# Patient Record
Sex: Male | Born: 1980 | Race: White | Hispanic: No | Marital: Married | Smoking: Never smoker
Health system: Southern US, Community
[De-identification: ages and names within clinical notes are randomized; demographics above are authoritative.]

## PROBLEM LIST (undated history)

## (undated) DIAGNOSIS — R000 Tachycardia, unspecified: Secondary | ICD-10-CM

## (undated) DIAGNOSIS — R7303 Prediabetes: Secondary | ICD-10-CM

## (undated) DIAGNOSIS — R531 Weakness: Secondary | ICD-10-CM

## (undated) DIAGNOSIS — G43009 Migraine without aura, not intractable, without status migrainosus: Secondary | ICD-10-CM

## (undated) DIAGNOSIS — Z8673 Personal history of transient ischemic attack (TIA), and cerebral infarction without residual deficits: Secondary | ICD-10-CM

## (undated) DIAGNOSIS — G4733 Obstructive sleep apnea (adult) (pediatric): Secondary | ICD-10-CM

## (undated) DIAGNOSIS — I10 Essential (primary) hypertension: Secondary | ICD-10-CM

## (undated) DIAGNOSIS — C9241 Acute promyelocytic leukemia, in remission: Secondary | ICD-10-CM

## (undated) DIAGNOSIS — L409 Psoriasis, unspecified: Secondary | ICD-10-CM

## (undated) HISTORY — PX: SHOULDER SURGERY: SHX246

## (undated) HISTORY — DX: Acute promyelocytic leukemia, in remission: C92.41

## (undated) HISTORY — PX: COLONOSCOPY: SHX174

## (undated) HISTORY — DX: Essential (primary) hypertension: I10

## (undated) HISTORY — DX: Weakness: R53.1

## (undated) HISTORY — PX: KIDNEY STONE SURGERY: SHX686

## (undated) HISTORY — DX: Obstructive sleep apnea (adult) (pediatric): G47.33

## (undated) HISTORY — DX: Tachycardia, unspecified: R00.0

## (undated) HISTORY — DX: Personal history of transient ischemic attack (TIA), and cerebral infarction without residual deficits: Z86.73

## (undated) HISTORY — DX: Prediabetes: R73.03

## (undated) HISTORY — PX: FOOT SURGERY: SHX648

## (undated) HISTORY — DX: Psoriasis, unspecified: L40.9

## (undated) HISTORY — DX: Migraine without aura, not intractable, without status migrainosus: G43.009

## (undated) HISTORY — PX: WRIST SURGERY: SHX841

---

## 2004-05-27 ENCOUNTER — Other Ambulatory Visit: Payer: Self-pay

## 2005-01-30 ENCOUNTER — Emergency Department: Payer: Self-pay | Admitting: Emergency Medicine

## 2005-10-26 ENCOUNTER — Emergency Department: Payer: Self-pay | Admitting: Emergency Medicine

## 2006-05-15 ENCOUNTER — Emergency Department: Payer: Self-pay | Admitting: Emergency Medicine

## 2006-11-17 ENCOUNTER — Emergency Department: Payer: Self-pay | Admitting: Emergency Medicine

## 2007-06-05 ENCOUNTER — Emergency Department: Payer: Self-pay

## 2007-06-19 ENCOUNTER — Emergency Department: Payer: Self-pay | Admitting: Emergency Medicine

## 2007-10-10 ENCOUNTER — Emergency Department: Payer: Self-pay | Admitting: Emergency Medicine

## 2008-04-17 ENCOUNTER — Ambulatory Visit: Payer: Self-pay | Admitting: General Practice

## 2008-05-23 ENCOUNTER — Ambulatory Visit: Payer: Self-pay | Admitting: Orthopedic Surgery

## 2008-07-31 ENCOUNTER — Emergency Department: Payer: Self-pay | Admitting: Emergency Medicine

## 2008-08-23 IMAGING — CR DG CHEST 2V
1 series · 2 of 2 positions shown · non-contrast
Comparison: none

REASON FOR EXAM: Pre-employment physical     [employer]           Fax
report to: [id number]
COMMENTS:

PROCEDURE:     DXR - DXR CHEST 2 VIEWS DAESIK VADER  - April 17, 2008 [DATE]
RESULT:     PA and lateral views of the chest were obtained.
The lung fields are clear. No pneumonia, pneumothorax or pleural effusion is
seen. The heart size is normal. No acute bony abnormalities are seen.

[Series 1: view not recorded · 0.17mm/px · 2 of 2 slices shown]
[im 1/2]
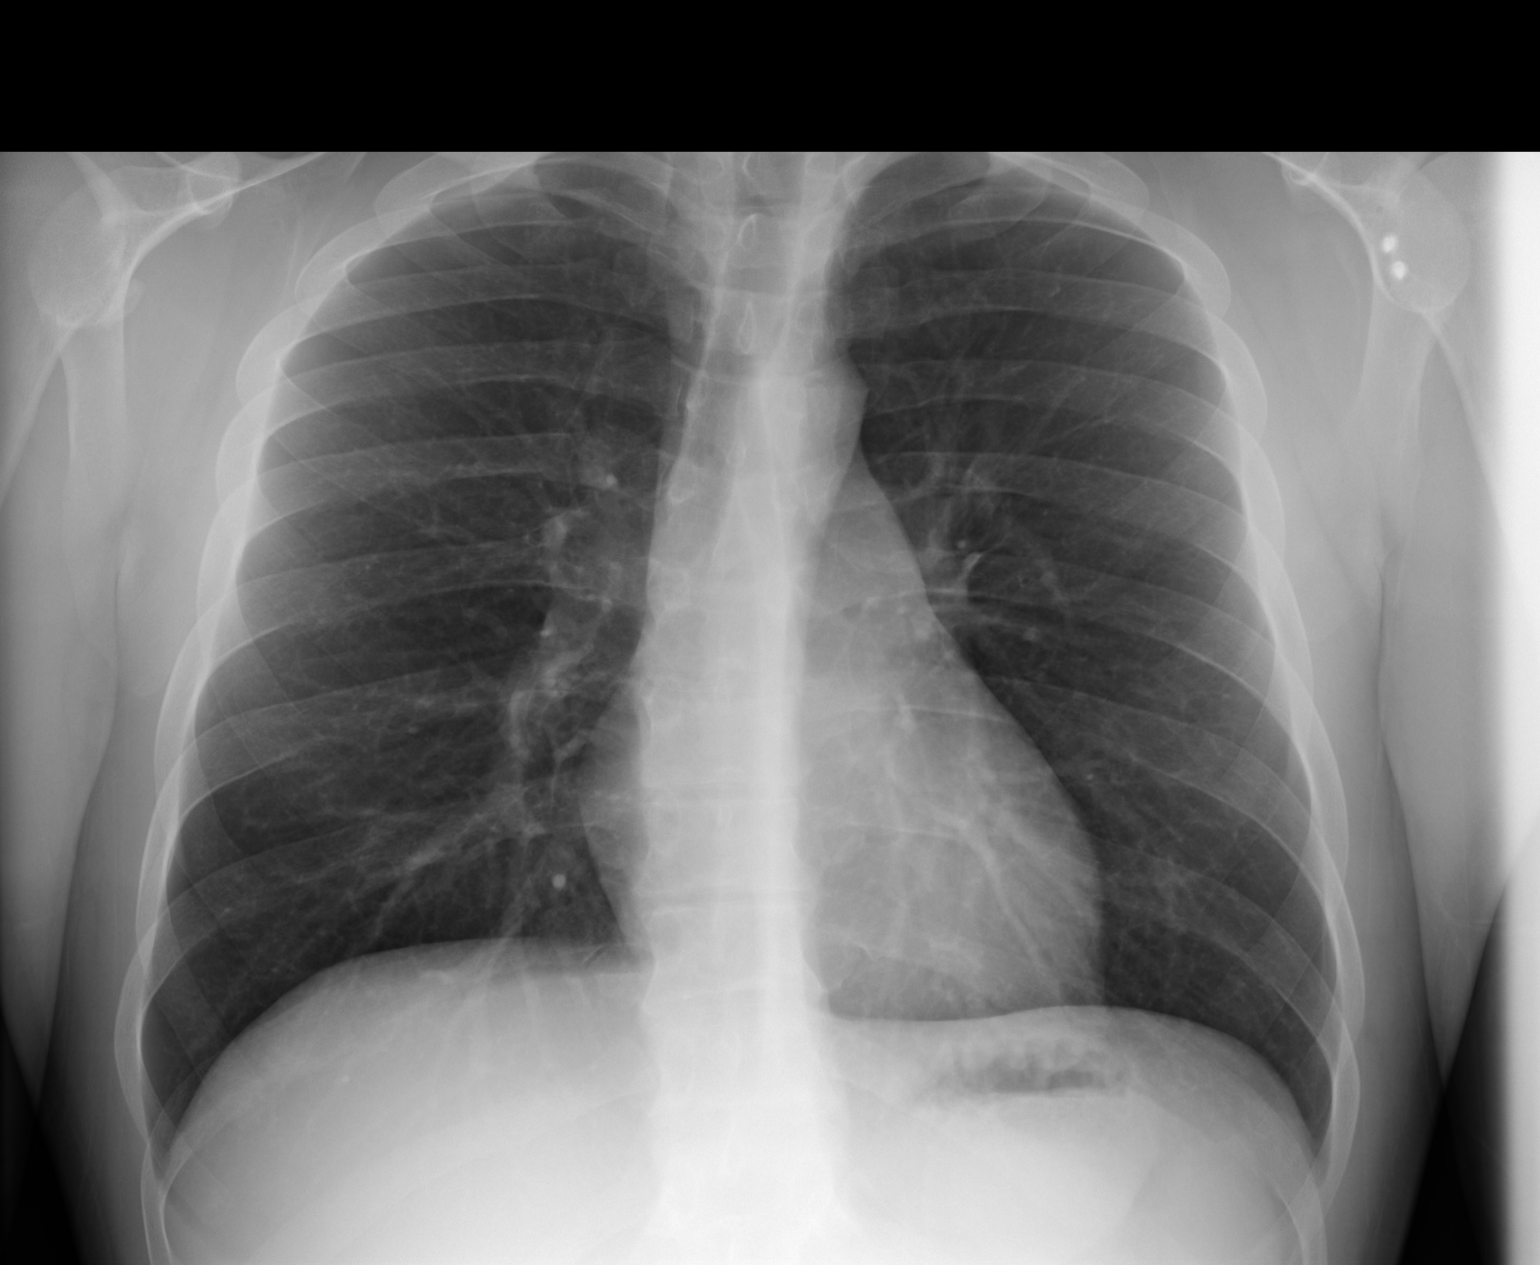
[im 2/2]
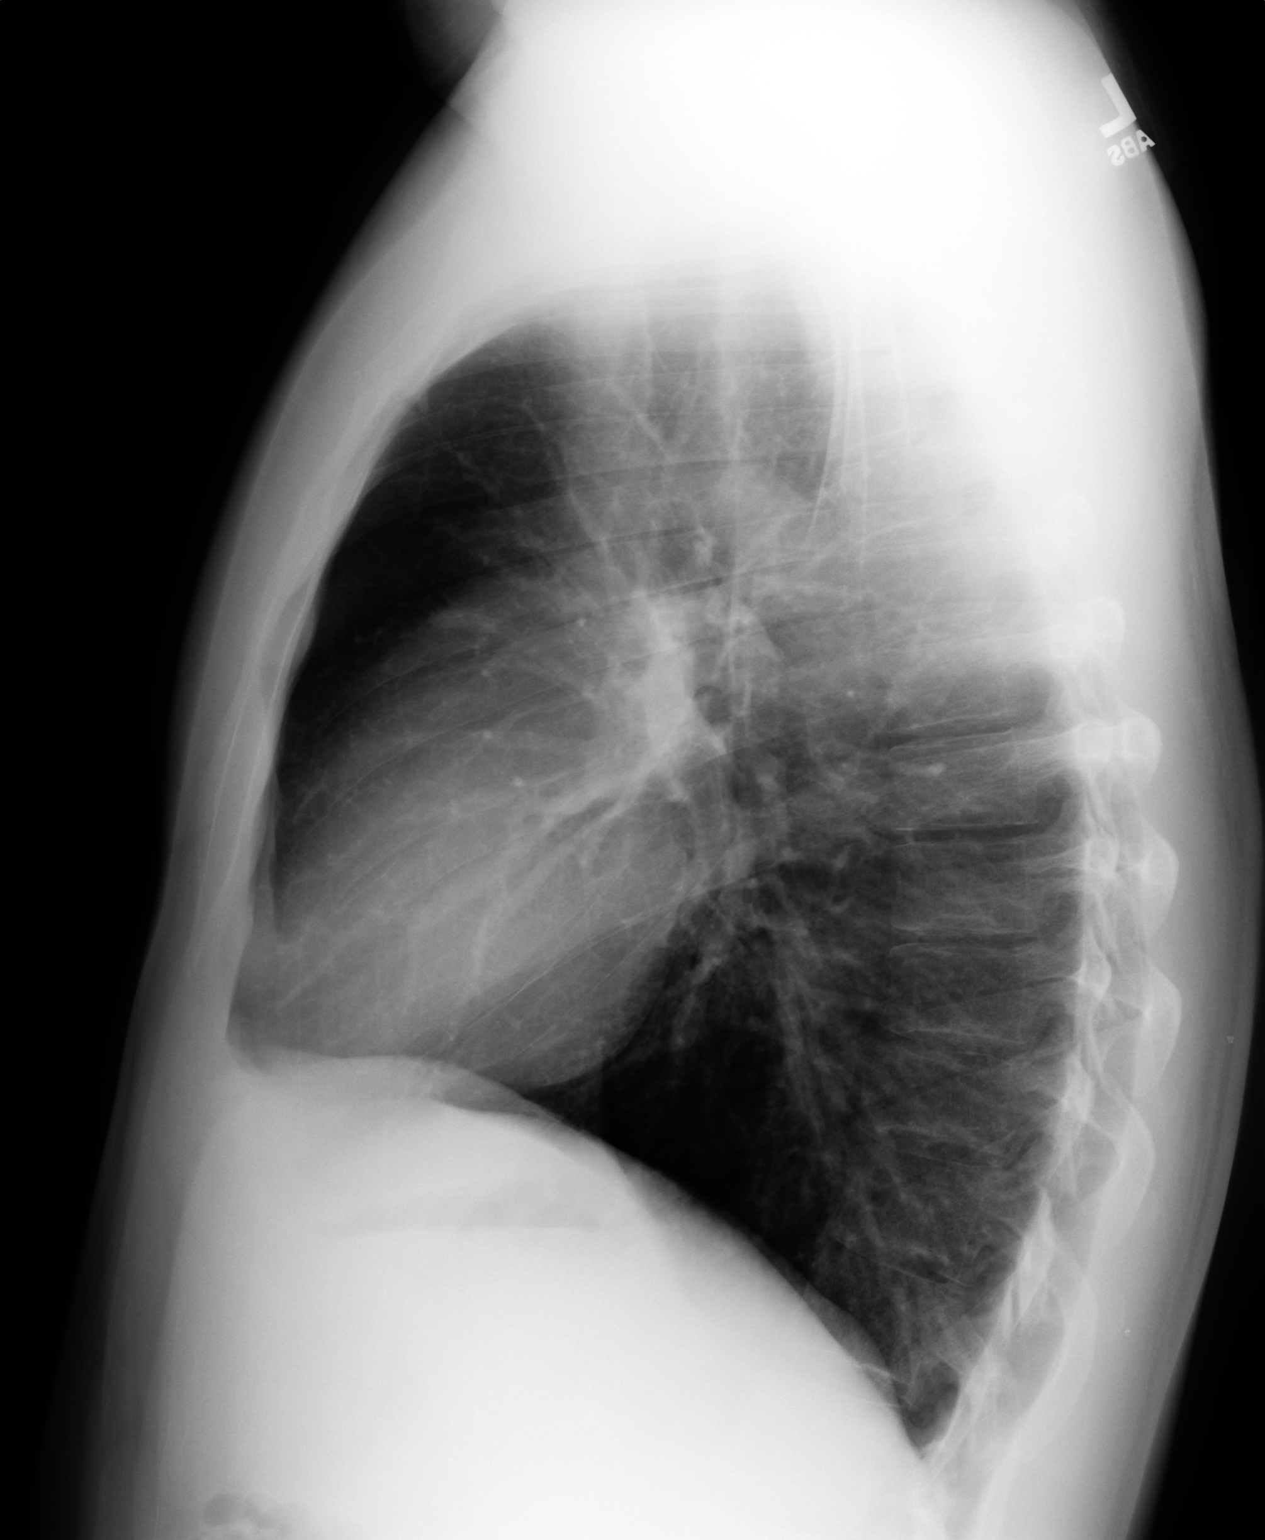

[2 of 2 positions shown; findings below may reference images not displayed]

IMPRESSION: 1.  No acute changes are identified.
2.  The lung fields are clear.
3.  The heart size is normal.

## 2008-10-20 ENCOUNTER — Encounter: Admission: RE | Admit: 2008-10-20 | Discharge: 2008-10-20 | Payer: Self-pay | Admitting: Orthopedic Surgery

## 2008-12-06 IMAGING — CR DG SHOULDER 3+V*L*
1 series · 4 of 4 positions shown · non-contrast
Comparison: none

REASON FOR EXAM: Injury
COMMENTS:

PROCEDURE:     DXR - DXR SHOULDER LEFT COMPLETE  - July 31, 2008  [DATE]
RESULT:     Images of the LEFT shoulder demonstrate the humerus is displaced
consistent with an anterior dislocation. A definite fracture is not
identified in the proximal humerus. The glenoid is poorly seen.

[Series 1: view not recorded · 0.17mm/px · 4 of 4 slices shown]
[im 1/4]
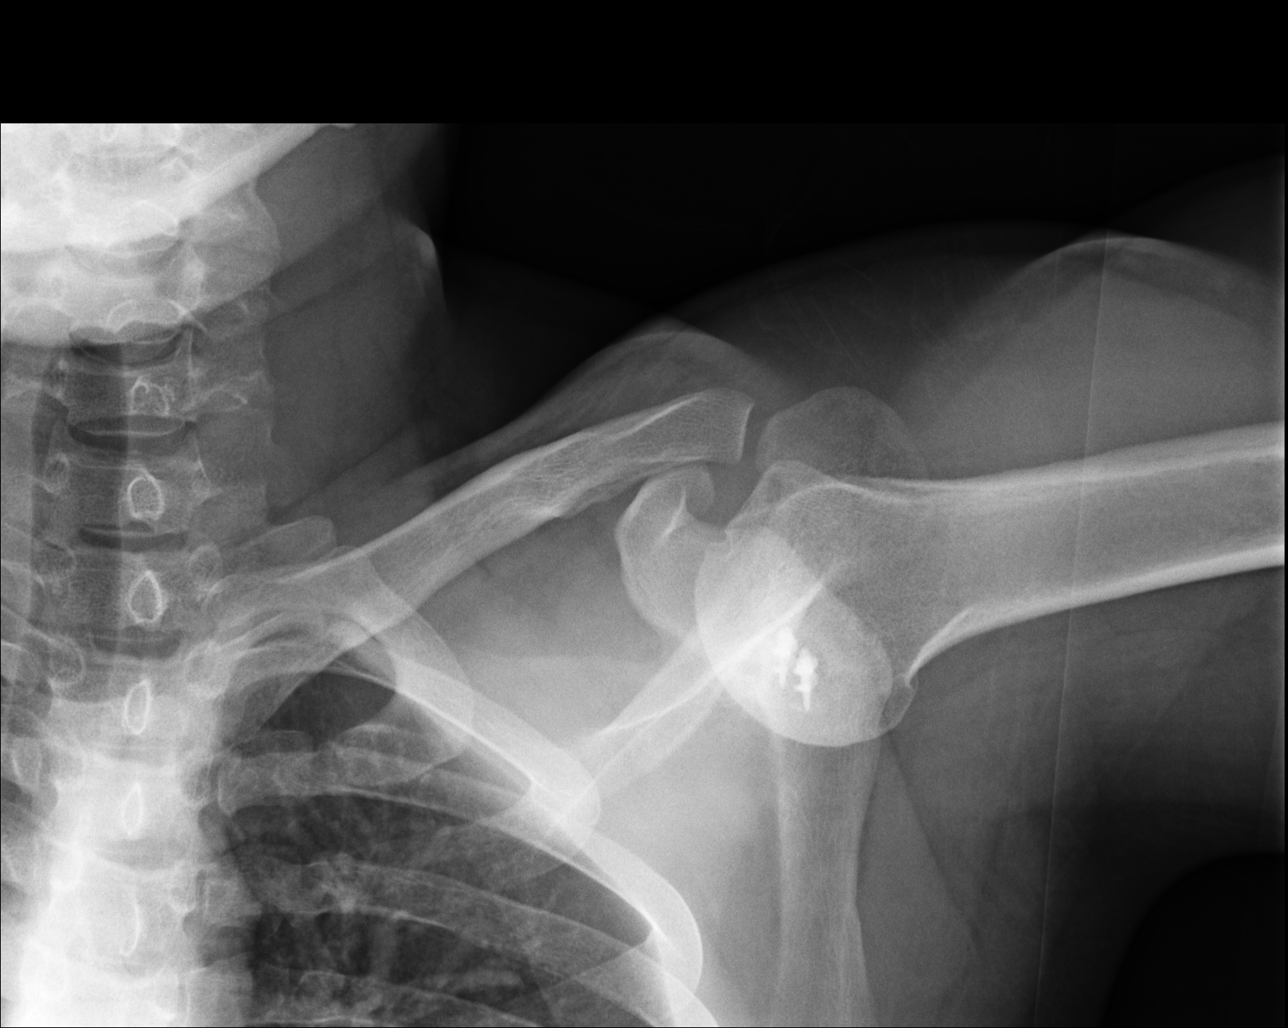
[im 2/4]
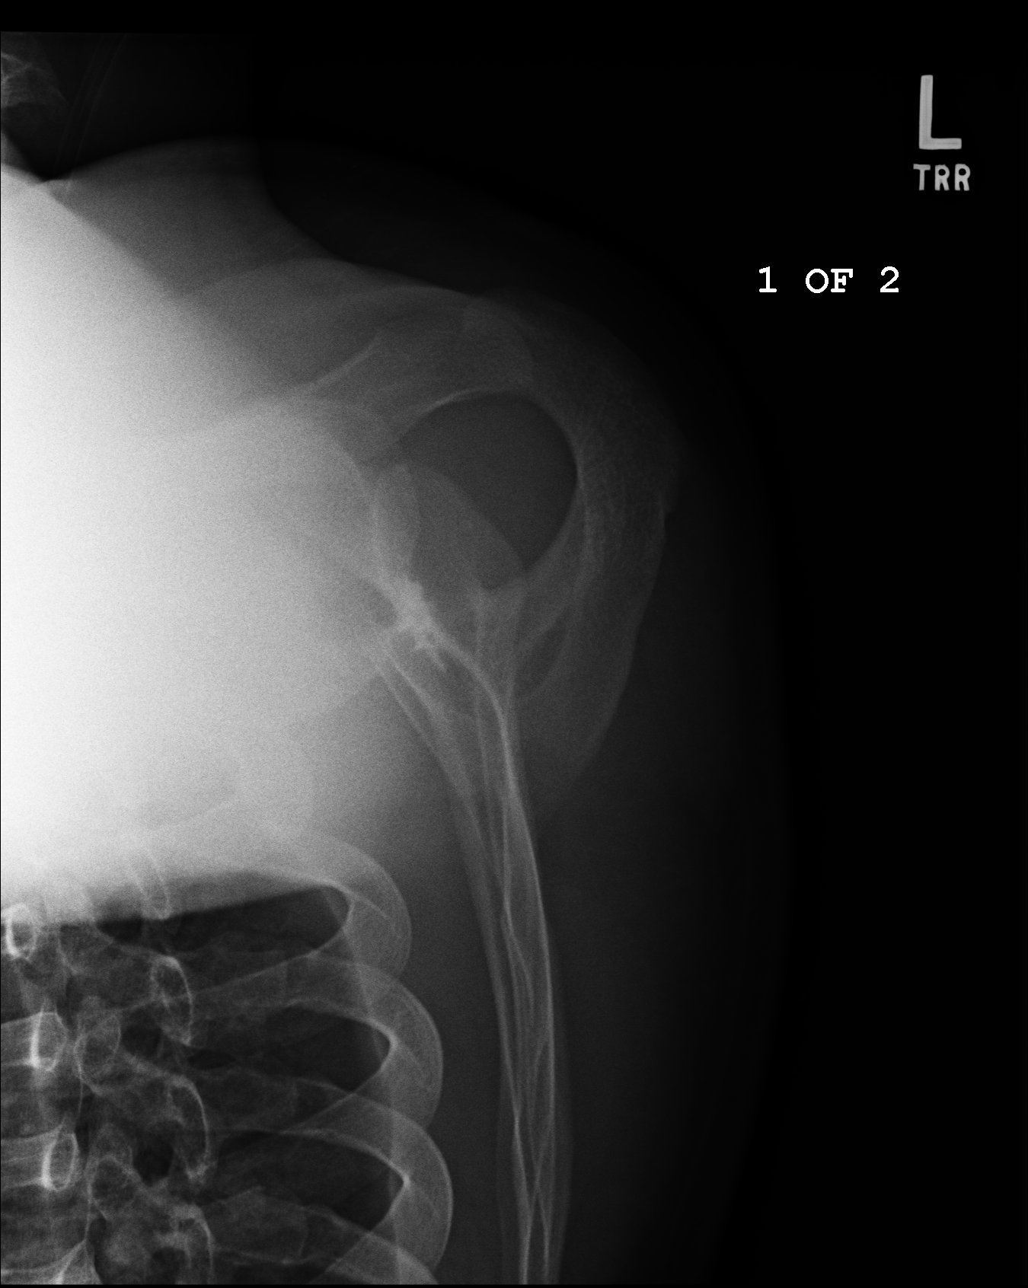
[im 3/4]
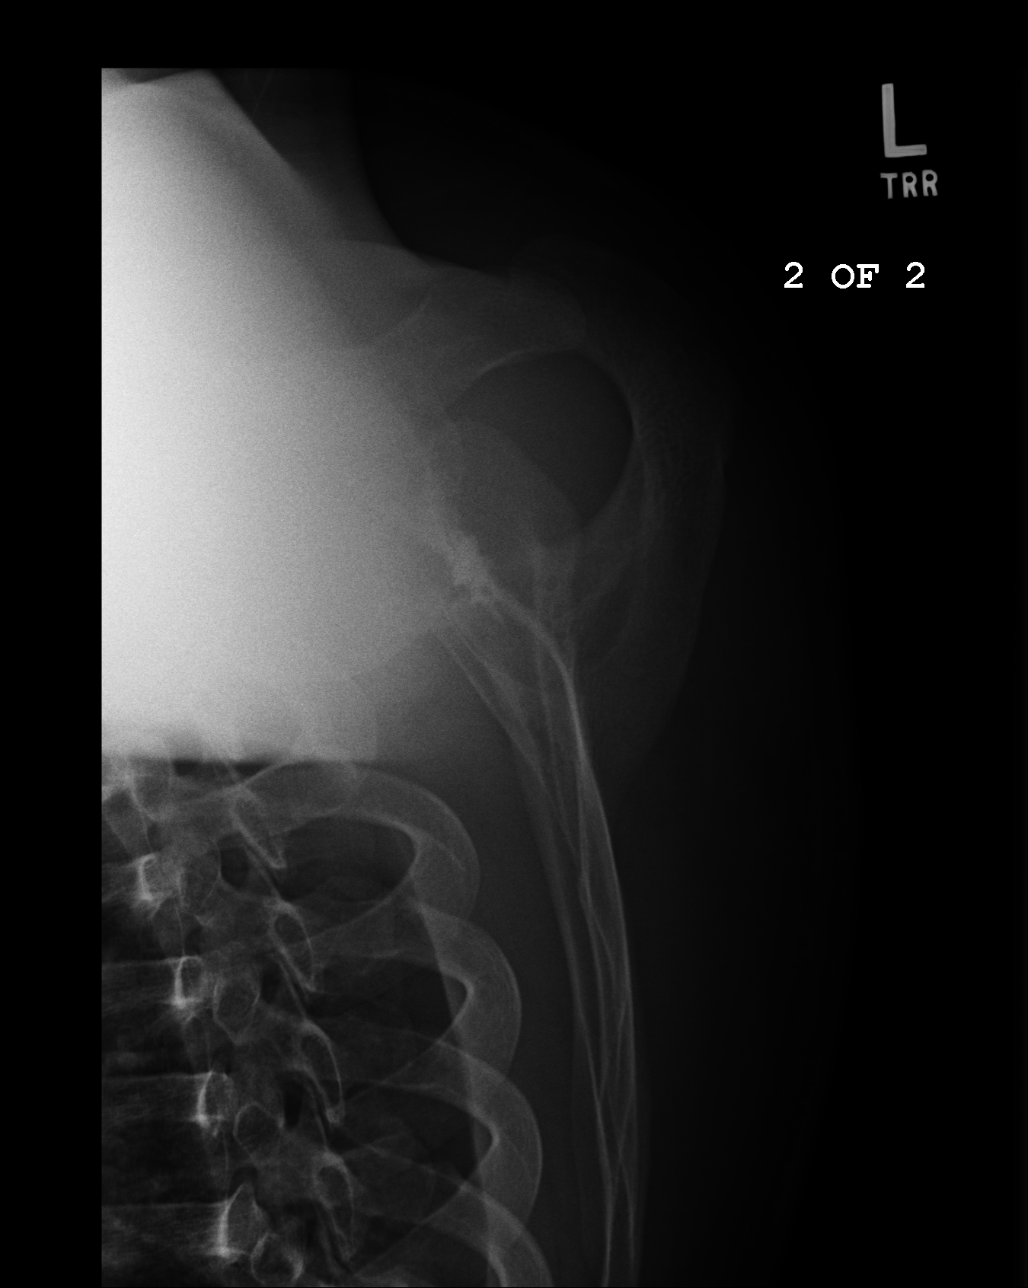
[im 4/4]
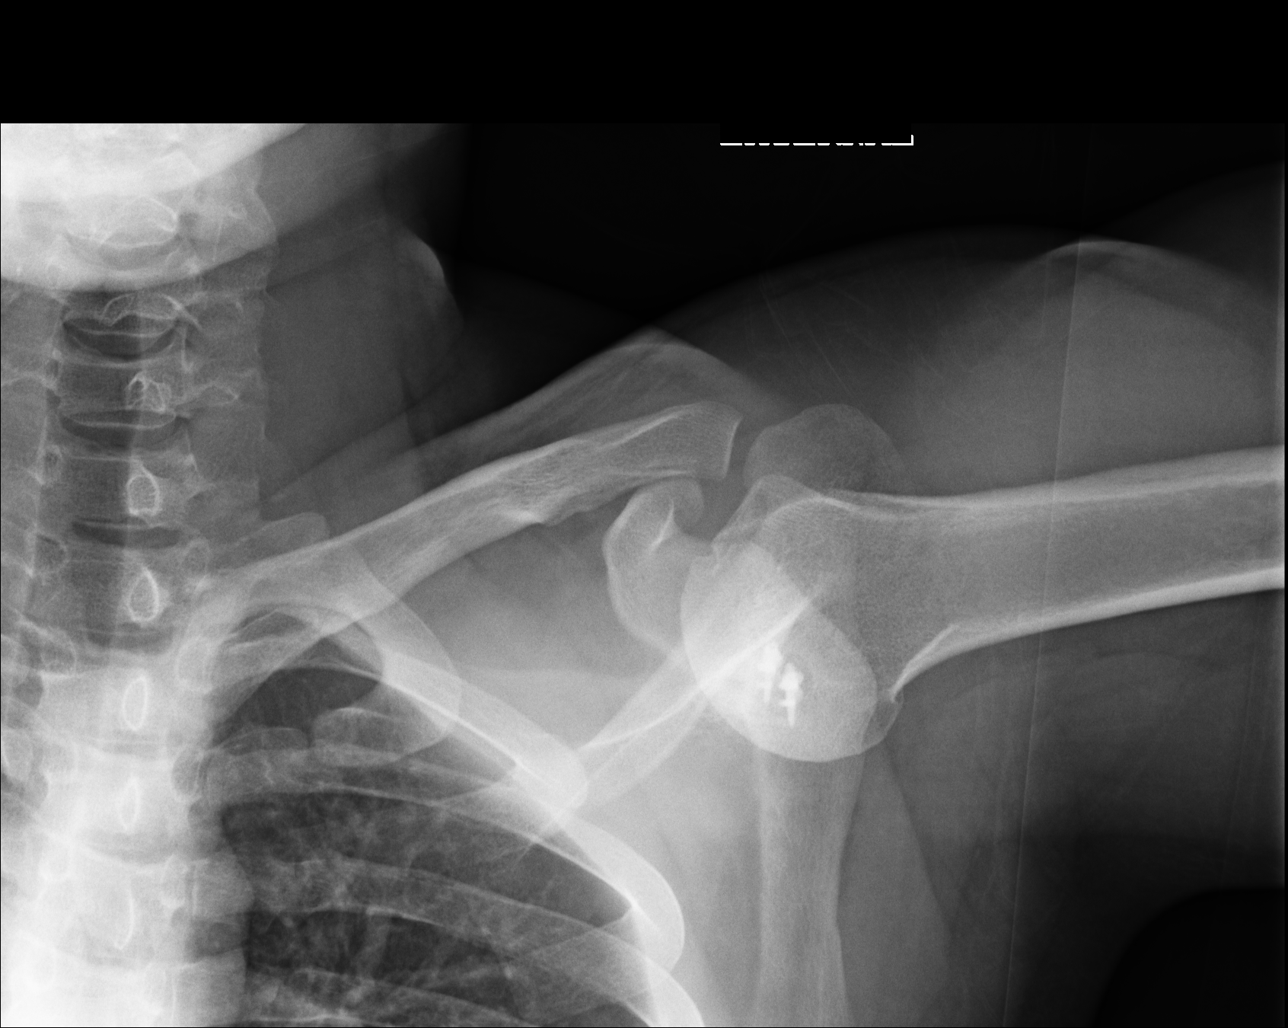

[4 of 4 positions shown; findings below may reference images not displayed]

IMPRESSION: Dislocation of the LEFT humeral head. This appears to be
anterior. Post reduction images are recommended.

## 2009-04-11 ENCOUNTER — Ambulatory Visit (HOSPITAL_COMMUNITY): Admission: RE | Admit: 2009-04-11 | Discharge: 2009-04-11 | Payer: Self-pay | Admitting: Orthopedic Surgery

## 2012-07-26 ENCOUNTER — Encounter (HOSPITAL_COMMUNITY): Payer: Self-pay | Admitting: Emergency Medicine

## 2012-07-26 ENCOUNTER — Observation Stay (HOSPITAL_COMMUNITY)
Admission: EM | Admit: 2012-07-26 | Discharge: 2012-07-27 | Disposition: A | Discharge status: Home / Self Care | Payer: PRIVATE HEALTH INSURANCE | Attending: Emergency Medicine | Admitting: Emergency Medicine

## 2012-07-26 ENCOUNTER — Emergency Department (HOSPITAL_COMMUNITY): Payer: PRIVATE HEALTH INSURANCE

## 2012-07-26 DIAGNOSIS — R079 Chest pain, unspecified: Principal | ICD-10-CM | POA: Insufficient documentation

## 2012-07-26 LAB — CBC WITH DIFFERENTIAL/PLATELET
Basophils Absolute: 0 10*3/uL (ref 0.0–0.1)
Basophils Relative: 0 % (ref 0–1)
Eosinophils Absolute: 0.2 10*3/uL (ref 0.0–0.7)
Eosinophils Relative: 2 % (ref 0–5)
HCT: 41.5 % (ref 39.0–52.0)
Hemoglobin: 15 g/dL (ref 13.0–17.0)
MCH: 31.4 pg (ref 26.0–34.0)
MCHC: 36.1 g/dL — ABNORMAL HIGH (ref 30.0–36.0)
MCV: 86.8 fL (ref 78.0–100.0)
Monocytes Absolute: 0.5 10*3/uL (ref 0.1–1.0)
RBC: 4.78 MIL/uL (ref 4.22–5.81)
RDW: 12.2 % (ref 11.5–15.5)
WBC: 10.7 10*3/uL — ABNORMAL HIGH (ref 4.0–10.5)

## 2012-07-26 LAB — COMPREHENSIVE METABOLIC PANEL
Albumin: 4.2 g/dL (ref 3.5–5.2)
BUN: 13 mg/dL (ref 6–23)
Creatinine, Ser: 1.12 mg/dL (ref 0.50–1.35)
GFR calc Af Amer: >90 mL/min (ref >90)
Glucose, Bld: 93 mg/dL (ref 70–99)
Total Protein: 6.8 g/dL (ref 6.0–8.3)

## 2012-07-26 MED ORDER — KETOROLAC TROMETHAMINE 30 MG/ML IJ SOLN
30.0000 mg | Freq: Once | INTRAMUSCULAR | Status: AC
Start: 2012-07-26 — End: 2012-07-26
  Administered 2012-07-26: 30 mg via INTRAVENOUS
  Filled 2012-07-26: qty 1

## 2012-07-26 NOTE — ED Provider Notes (Signed)
History     CSN: [id number]  Arrival date & time 07/26/12  1250   First MD Initiated Contact with Patient 07/26/12 1300      Chief Complaint  Patient presents with  . Chest Pain    (Consider location/radiation/quality/duration/timing/severity/associated sxs/prior treatment) HPI Comments: Patient with no significant pmh presents after the onset of sharp, stabbing pain to the center of his chest.  It felt as thought "a knife was being pushed into his chest, then pulled back out".  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  He denies any shortness of breath, nausea, but did feel sweaty at one point.  He denies any recent exertional symptoms.  He says the office staff gave him a nitro with minimal relief, and was given another by ems with slight relief.  Feels better now, but not symptom-free.  Patient is a 31 y.o. male presenting with chest pain. The history is provided by the patient.  Chest Pain Episode onset: 11AM. Chest pain occurs constantly. The chest pain is improving. Associated with: nothing. The severity of the pain is moderate. The quality of the pain is described as sharp. The pain does not radiate. Exacerbated by: nothing. Pertinent negatives for primary symptoms include no fever, no cough, no palpitations and no nausea. He tried nitroglycerin for the symptoms. Risk factors include no known risk factors.     History reviewed. No pertinent past medical history.  History reviewed. No pertinent past surgical history.  No family history on file.  History  Substance Use Topics  . Smoking status: Never Smoker   . Smokeless tobacco: Not on file  . Alcohol Use: Yes      Review of Systems  Constitutional: Negative for fever.  Respiratory: Negative for cough.   Cardiovascular: Positive for chest pain. Negative for palpitations.  Gastrointestinal: Negative for nausea.  All other systems reviewed and are negative.    Allergies    Review of patient's allergies indicates no known allergies.  Home Medications   Current Outpatient Rx  Name Route Sig Dispense Refill  . ACETAMINOPHEN 500 MG PO TABS Oral Take 1,000 mg by mouth every 6 (six) hours as needed. For pain      BP 138/92  Pulse 63  Temp 98 F (36.7 C) (Oral)  Resp 12  SpO2 100%  Physical Exam  Nursing note and vitals reviewed. Constitutional: He is oriented to person, place, and time. He appears well-developed and well-nourished. No distress.  HENT:  Head: Normocephalic and atraumatic.  Mouth/Throat: Oropharynx is clear and moist.  Neck: Normal range of motion. Neck supple.  Cardiovascular: Normal rate and regular rhythm.   No murmur heard. Pulmonary/Chest: Effort normal and breath sounds normal. No respiratory distress. He has no wheezes.  Abdominal: Soft. Bowel sounds are normal. He exhibits no distension. There is no tenderness.  Musculoskeletal: Normal range of motion. He exhibits no edema.  Lymphadenopathy:    He has no cervical adenopathy.  Neurological: He is alert and oriented to person, place, and time.  Skin: Skin is warm and dry. He is not diaphoretic.    ED Course  Procedures (including critical care time)  Labs Reviewed - No data to display No results found.   No diagnosis found.   Date: 07/26/2012  Rate: 61  Rhythm: normal sinus rhythm  QRS Axis: normal  Intervals: normal  ST/T Wave abnormalities: normal  Conduction Disutrbances:none  Narrative Interpretation:   Old EKG Reviewed: none available  MDM  The patient presents here with chest discomfort that is atypical for cardiac pain.  His only risk factors are a family history of his father having heart trouble in his 88's.  The patient is 69, healthy, athletic, and has no exertional symptoms.  The workup is unremarkable and he is feeling better with toradol.  I had planned to discharge him to home with follow up as he appears low risk and appropriate for outpatient  workup.  However the wife informed me that "they weren't leaving until something was done".  I had a lengthy conversation with them, and informed them that no cath or invasive procedure would be done until there was some objective evidence of a cardiac etiology.  The most appropriate next test in my opinion is a stress test.  We have decided that we will place him in the CDU for rule out of mi and stress test in the morning.  If this test is normal, as I suspect it will be, he will be discharged to home with follow up with his pcp.          Geoffery Lyons, MD 07/26/12 [phone]

## 2012-07-26 NOTE — ED Notes (Signed)
i-Stat Tropoinin Results:  cTnl 0.00 ng/mL 

## 2012-07-26 NOTE — ED Notes (Signed)
To ED from work via EMS, c/o sudden onset central CP with no radiation, sinus arrhythmia per EMS, 18 L hand, 2L, VSS, 325 ASA and 2 nitros pta, NAD

## 2012-07-26 NOTE — ED Notes (Signed)
Pt resting.  Denies any needs.   

## 2012-07-26 NOTE — ED Notes (Signed)
i-Stat Troponin Results:  cTnl 0.00 ng/mL 

## 2012-07-27 DIAGNOSIS — R072 Precordial pain: Secondary | ICD-10-CM

## 2012-07-27 LAB — POCT I-STAT TROPONIN I: Troponin i, poc: 0 ng/mL (ref 0.00–0.08)

## 2012-07-27 NOTE — ED Provider Notes (Signed)
Medical screening examination/treatment/procedure(s) were performed by non-physician practitioner and as supervising physician I was immediately available for consultation/collaboration.   Gavin Pound. Adeli Frost, MD 07/27/12 1958

## 2012-07-27 NOTE — ED Notes (Signed)
Pt still in vascular lab.

## 2012-07-27 NOTE — Progress Notes (Signed)
  Echocardiogram Echocardiogram Stress Test has been performed.  Gabriel English 07/27/2012, 10:45 AM

## 2012-07-27 NOTE — ED Provider Notes (Signed)
31 year old male in CDU on chest pain protocol. Currently he is resting comfortably in bed. He admits to a very dull ache over his left pectoral region as constant. Denies any chest wall tenderness. Denies any nausea, vomiting, diaphoresis, headache, leg swelling. On exam patient is resting comfortably and in no apparent distress. He is AAO x3. Heart RRR. Lungs CTA bilateral. No extremity edema. Abdomen is soft, nontender with normal bowel sounds. He has a normal mood and affect. His skin is warm and dry. Awaiting stress test this morning. 11:23 AM Stress test negative. He is stable for discharge. Advised followup with his PCP.  Trevor Mace, PA-C 07/27/12 1128

## 2013-04-22 ENCOUNTER — Emergency Department: Payer: Self-pay | Admitting: Emergency Medicine

## 2014-05-08 ENCOUNTER — Ambulatory Visit: Payer: Self-pay

## 2014-05-08 LAB — COMPREHENSIVE METABOLIC PANEL
ALBUMIN: 4.1 g/dL (ref 3.4–5.0)
ALK PHOS: 73 U/L
ALT: 34 U/L (ref 12–78)
Anion Gap: 10 (ref 7–16)
BUN: 10 mg/dL (ref 7–18)
Bilirubin,Total: 0.6 mg/dL (ref 0.2–1.0)
CHLORIDE: 106 mmol/L (ref 98–107)
Calcium, Total: 9.2 mg/dL (ref 8.5–10.1)
Co2: 26 mmol/L (ref 21–32)
Creatinine: 1.1 mg/dL (ref 0.60–1.30)
EGFR (African American): >60
Glucose: 103 mg/dL — ABNORMAL HIGH (ref 65–99)
OSMOLALITY: 282 (ref 275–301)
POTASSIUM: 3.8 mmol/L (ref 3.5–5.1)
SGOT(AST): 20 U/L (ref 15–37)
Sodium: 142 mmol/L (ref 136–145)
TOTAL PROTEIN: 7.4 g/dL (ref 6.4–8.2)

## 2014-05-08 LAB — CBC WITH DIFFERENTIAL/PLATELET
Basophil #: 0.1 10*3/uL (ref 0.0–0.1)
Basophil %: 0.9 %
Eosinophil #: 0.4 10*3/uL (ref 0.0–0.7)
Eosinophil %: 4.7 %
HCT: 43.8 % (ref 40.0–52.0)
HGB: 15 g/dL (ref 13.0–18.0)
LYMPHS ABS: 3.1 10*3/uL (ref 1.0–3.6)
LYMPHS PCT: 38 %
MCH: 30.7 pg (ref 26.0–34.0)
MCHC: 34.3 g/dL (ref 32.0–36.0)
MCV: 90 fL (ref 80–100)
Monocyte #: 0.5 x10 3/mm (ref 0.2–1.0)
Monocyte %: 5.7 %
NEUTROS PCT: 50.7 %
Neutrophil #: 4.1 10*3/uL (ref 1.4–6.5)
Platelet: 216 10*3/uL (ref 150–440)
RBC: 4.89 10*6/uL (ref 4.40–5.90)
RDW: 12.7 % (ref 11.5–14.5)
WBC: 8.2 10*3/uL (ref 3.8–10.6)

## 2014-05-08 LAB — MAGNESIUM: MAGNESIUM: 2.3 mg/dL

## 2014-05-13 DIAGNOSIS — R000 Tachycardia, unspecified: Secondary | ICD-10-CM | POA: Insufficient documentation

## 2014-05-20 DIAGNOSIS — I471 Supraventricular tachycardia, unspecified: Secondary | ICD-10-CM | POA: Insufficient documentation

## 2014-08-25 ENCOUNTER — Ambulatory Visit: Payer: Self-pay | Admitting: Physician Assistant

## 2020-01-19 ENCOUNTER — Ambulatory Visit: Payer: PRIVATE HEALTH INSURANCE | Attending: Internal Medicine

## 2020-01-19 DIAGNOSIS — Z23 Encounter for immunization: Secondary | ICD-10-CM

## 2020-01-19 NOTE — Progress Notes (Signed)
   Covid-19 Vaccination Clinic  Name:  Raymel Vandongen    MRN: [id number] DOB: 1981/10/10  01/19/2020  Mr. Schaufler was observed post Covid-19 immunization for 15 minutes without incident. He was provided with Vaccine Information Sheet and instruction to access the V-Safe system.   Mr. Wilner was instructed to call 911 with any severe reactions post vaccine: Marland Kitchen Difficulty breathing  . Swelling of face and throat  . A fast heartbeat  . A bad rash all over body  . Dizziness and weakness   Immunizations Administered    Name Date Dose VIS Date Route   Pfizer COVID-19 Vaccine 01/19/2020  3:19 PM 0.3 mL 10/11/2019 Intramuscular   Manufacturer: [place]   Lot: [id number]   [place]: [id number]

## 2020-02-09 ENCOUNTER — Ambulatory Visit: Payer: PRIVATE HEALTH INSURANCE | Attending: Internal Medicine

## 2020-02-09 DIAGNOSIS — Z23 Encounter for immunization: Secondary | ICD-10-CM

## 2020-02-09 NOTE — Progress Notes (Signed)
   Covid-19 Vaccination Clinic  Name:  Gabriel English    MRN: [id number] DOB: [birth year]  02/09/2020  [name] was observed post Covid-19 immunization for 15 minutes without incident. He was provided with Vaccine Information Sheet and instruction to access the V-Safe system.   [name] was instructed to call 911 with any severe reactions post vaccine: Marland Kitchen Difficulty breathing  . Swelling of face and throat  . A fast heartbeat  . A bad rash all over body  . Dizziness and weakness   Immunizations Administered    Name Date Dose VIS Date Route   Pfizer COVID-19 Vaccine 02/09/2020  3:11 PM 0.3 mL 10/11/2019 Intramuscular   Manufacturer: [place]   Lot: [id number]   [place]: [id number]

## 2020-07-01 ENCOUNTER — Other Ambulatory Visit: Payer: PRIVATE HEALTH INSURANCE

## 2020-07-01 ENCOUNTER — Other Ambulatory Visit: Payer: Self-pay

## 2020-07-01 ENCOUNTER — Other Ambulatory Visit: Payer: Self-pay | Admitting: Critical Care Medicine

## 2020-07-01 DIAGNOSIS — Z20822 Contact with and (suspected) exposure to covid-19: Secondary | ICD-10-CM

## 2020-07-02 LAB — NOVEL CORONAVIRUS, NAA: SARS-CoV-2, NAA: NOT DETECTED

## 2020-09-08 DIAGNOSIS — C9241 Acute promyelocytic leukemia, in remission: Secondary | ICD-10-CM | POA: Insufficient documentation

## 2020-11-10 DIAGNOSIS — R519 Headache, unspecified: Secondary | ICD-10-CM | POA: Insufficient documentation

## 2021-09-29 DIAGNOSIS — H538 Other visual disturbances: Secondary | ICD-10-CM | POA: Insufficient documentation

## 2021-09-29 DIAGNOSIS — R531 Weakness: Secondary | ICD-10-CM | POA: Insufficient documentation

## 2021-10-07 DIAGNOSIS — I10 Essential (primary) hypertension: Secondary | ICD-10-CM | POA: Insufficient documentation

## 2022-03-07 DIAGNOSIS — G43009 Migraine without aura, not intractable, without status migrainosus: Secondary | ICD-10-CM | POA: Insufficient documentation

## 2022-03-07 DIAGNOSIS — R7303 Prediabetes: Secondary | ICD-10-CM | POA: Insufficient documentation

## 2022-03-07 DIAGNOSIS — L409 Psoriasis, unspecified: Secondary | ICD-10-CM | POA: Insufficient documentation

## 2022-08-27 DIAGNOSIS — G4733 Obstructive sleep apnea (adult) (pediatric): Secondary | ICD-10-CM | POA: Insufficient documentation

## 2022-10-06 DIAGNOSIS — M76892 Other specified enthesopathies of left lower limb, excluding foot: Secondary | ICD-10-CM | POA: Insufficient documentation

## 2022-10-12 ENCOUNTER — Ambulatory Visit: Payer: Self-pay | Admitting: Physical Therapy

## 2022-10-12 ENCOUNTER — Ambulatory Visit: Payer: Medicaid Other | Attending: Surgery | Admitting: Physical Therapy

## 2022-10-12 DIAGNOSIS — R262 Difficulty in walking, not elsewhere classified: Secondary | ICD-10-CM

## 2022-10-12 DIAGNOSIS — M25552 Pain in left hip: Secondary | ICD-10-CM

## 2022-10-12 DIAGNOSIS — M25652 Stiffness of left hip, not elsewhere classified: Secondary | ICD-10-CM

## 2022-10-12 DIAGNOSIS — M6281 Muscle weakness (generalized): Secondary | ICD-10-CM | POA: Diagnosis present

## 2022-10-12 NOTE — Therapy (Signed)
OUTPATIENT PHYSICAL THERAPY LOWER QUARTER/HIP EVALUATION   Patient Name: Gabriel English MRN: [id number] DOB:1981/03/14, 41 y.o., male Today's Date: 10/15/2022  END OF SESSION:  PT End of Session - 10/15/22 0559     Visit Number 1    Number of Visits 13    Date for PT Re-Evaluation 11/24/22    Authorization Type [employer] 2023    Authorization Time Period VL based on auth    Progress Note Due on Visit 10    PT Start Time 1500    PT Stop Time 1545    PT Time Calculation (min) 45 min    Activity Tolerance Patient tolerated treatment well    Behavior During Therapy WFL for tasks assessed/performed             Past Medical History:  Diagnosis Date   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 There are no problems to display for this patient.   PCP: Cyndi Bender, PA-C  REFERRING PROVIDER: Elon Alas, PA-C  REFERRING DIAG: [id number] (ICD-10-CM) - Left hip pain   RATIONALE FOR EVALUATION AND TREATMENT: Rehabilitation  THERAPY DIAG: Pain in left hip  Difficulty in walking, not elsewhere classified  Stiffness of left hip, not elsewhere classified  Muscle weakness (generalized)  ONSET DATE: 3 months ago   FOLLOW-UP APPT SCHEDULED WITH REFERRING PROVIDER: No    SUBJECTIVE:                                                                                                                                                                                         SUBJECTIVE STATEMENT:  Patient is a 41 year old male with primary complaint of L hip pain with insidious atraumatic onset. 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  PERTINENT HISTORY: Patient is a 41 year old male with primary complaint of L hip pain with insidious atraumatic onset. Patient reports deep pain along lateral thigh/C region along hip and pain along L groin as well. Pt has Hx of leukemia; pt is in remission for leukemia. Pt had stroke last year and was able to recover well with use of TPA. Patient reports some intermittent LLE paresthesias. Pt has son with special needs and has to lean over his bed when helping with treatment; pt reports notable pain with sustained forward bending/trunk  flexion and with sitting - he often has to shift his weight in sitting to opposite hip.  PAIN:    Pain Intensity: Present: 5-6/10, Best: 3/10, Worst: 9/10 Pain location: R lateral hip/C-shape, R groin Pain Quality: intermittent, stabbing, "pulling" something, squeezing or "something in a bind" Radiating: No  Numbness/Tingling: Yes, tingling intermittently down to toes Focal Weakness: Yes, intermittent sensation of buckling Aggravating factors: Pain with trunk flexion/leaning over, sitting with weight on L side, pivoting over LLE, prolonged sitting/driving Relieving factors: offloading L hip, shifting weight to R side;  24-hour pain behavior: worse in AM How long can you sit: 30 minutes History of prior back injury, pain, surgery, or therapy: No   Imaging: Yes   LEFT HIP, 3 VIEWS WITH VISUALIZATION OF THE PELVIS  History: M25.552 Pain in left hip  Findings and impression:  1.No acute abnormality  2.Specifically, no significant degenerative change  3. No additional findings of significance   Red flags: Negative for bowel/bladder changes, saddle paresthesia, personal history of cancer, h/o spinal tumors, h/o compression fx, h/o abdominal aneurysm, abdominal pain, chills/fever, night sweats, nausea, vomiting, unrelenting pain, first onset of insidious LBP <20 y/o   PRECAUTIONS: None  WEIGHT BEARING RESTRICTIONS: No  FALLS: Has patient  fallen in last 6 months? 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  Recent memory is intact.  Remote memory is intact.  Attention span and concentration are intact.  Expressive speech is intact.  Patient's fund of knowledge is within normal limits for educational level.    Gross Musculoskeletal Assessment Tremor: None Bulk: Normal Tone: Normal No visible step-off along spinal column, no signs of scoliosis  GAIT: Distance walked: 50 ft Assistive device utilized: None Level of assistance: Complete Independence Comments: Mild decreased weight shift to LLE  Squat: pt does not demonstrate asymmetric weight shift, but he limits ROM 0-60 deg and demonstrates limited combined hip/knee flexion. Mild loss of neutral thoracolumbar spine with increased kyphosis at bottom of squat.   Posture: Lumbar lordosis: WNL Iliac crest height: Equal bilaterally Lumbar lateral shift: Negative  AROM AROM (Normal range in degrees) AROM   Lumbar   Flexion (65) 50%*  Extension (30) 100%  Right lateral flexion (25) 100%  Left lateral flexion (25) 100%  Right rotation (30) 100%*  Left rotation (30) 100%*      Hip Right Left  Flexion (125)  90  Extension (15)  10  Abduction (40)  45  Adduction     Internal Rotation (45)  30  External Rotation (45)  45      (* = pain; Blank rows = not tested)  LE MMT: MMT (out of 5) Right  Left   Hip flexion 5 4+  Hip extension    Hip abduction    Hip  adduction    Hip internal rotation 5 4-*  Hip external rotation 5 4*  Knee flexion 5 4*  Knee extension 5 4+  Ankle dorsiflexion    Ankle plantarflexion  Ankle inversion    Ankle eversion    (* = pain; Blank rows = not tested)  Sensation Grossly intact to light touch throughout bilateral LEs as determined by testing dermatomes L2-S2. Proprioception, stereognosis, and hot/cold testing deferred on this date.  Reflexes Deferred  Muscle Length Hamstrings: R: Negative L: Negative Ely (quadriceps): R: Positive L: Positive Thomas (hip flexors): R: Not examined L: Positive Ober: R: Not examined L: Not examined  Palpation Location Right Left         Lumbar paraspinals  0  Quadratus Lumborum  0  Gluteus Maximus  1  Gluteus Medius  1  Deep hip external rotators  1  PSIS  0  Fortin's Area (SIJ)  0  Greater Trochanter  0  TFL  1  (Blank rows = not tested) Graded on 0-4 scale (0 = no pain, 1 = pain, 2 = pain with wincing/grimacing/flinching, 3 = pain with withdrawal, 4 = unwilling to allow palpation)  Passive Accessory Intervertebral Motion Pt denies reproduction of back pain or concordant hip pain with CPA L1-L5 and UPA bilaterally L1-L5.   Special Tests Lumbar Radiculopathy and Discogenic: Centralization and Peripheralization (SN 92, -LR 0.12): Not done Slump (SN 83, -LR 0.32): R: Negative L: Negative SLR (SN 92, -LR 0.29): R: Negative L:  Negative   Hip: FABER (SN 81): R: Negative L: Positive FADIR (SN 94): R: Not examined L: Positive Hip scour (SN 50): R: Negative L: Positive     TODAY'S TREATMENT: DATE: 10/12/2022   Therapeutic Exercise - for HEP establishment, discussion on appropriate exercise/activity modification, PT education   Reviewed baseline home exercises and provided handout for [place] program (see Access Code); tactile cueing and therapist demonstration utilized as needed for carryover of proper technique to HEP.    Patient education on current  condition, anatomy involved, prognosis, plan of care. Discussion on activity modification to prevent flare-up of condition, including modified lunging/squatting with use of half kneel to limit extremes of ROM for L hip, use of recline for sitting to limit painful L hip flexion.      PATIENT EDUCATION:  Education details: see above for patient education details Person educated: Patient Education method: Explanation, Demonstration, and Handouts Education comprehension: verbalized understanding and returned demonstration   HOME EXERCISE PROGRAM:  Access Code: [email or web address] URL: [email or web address] Date: 10/12/2022 Prepared by: Gabriel English  Exercises - Modified Marcello Moores Stretch  - 2 x daily - 7 x weekly - 3 sets - 30sec hold - Supine Piriformis Stretch with Foot on Ground  - 2 x daily - 7 x weekly - 3 sets - 30sec hold   ASSESSMENT:  CLINICAL IMPRESSION: Patient is a 41 y.o. 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 Pt does have secondary posterolateral hip pain without significant tenderness directly at greater trochanter, consistent with referring diagnosis. Significant findings of his eval includes:  decreased gluteal and quad/hamstrings strength, decreased L hip flexion/extension/IR ROM, quadriceps and hip flexor tightness, tenderness to palpation along R posterolateral gluteal mm and TFL. Pt will benefit from skilled PT services to address deficits and improve function.   OBJECTIVE IMPAIRMENTS: difficulty walking, decreased ROM, decreased strength, hypomobility, impaired flexibility, and pain.   ACTIVITY LIMITATIONS: carrying, lifting, bending, sitting, squatting, and transfers  PARTICIPATION LIMITATIONS: cleaning, driving, community activity, occupation, and caring for son with special  needs  PERSONAL FACTORS: Past/current experiences  and 3+ comorbidities: HTN, leukemia in remission and response to cancer treatment, Hx of stroke)  are also affecting patient's functional outcome.   REHAB POTENTIAL: Good  CLINICAL DECISION MAKING: Evolving/moderate complexity  EVALUATION COMPLEXITY: Moderate   GOALS: Goals reviewed with patient? Yes  SHORT TERM GOALS: Target date: 11/03/2022  Pt will be independent with HEP in order to improve strength and decrease back pain to improve pain-free function at home and work. Baseline: 10/12/22: Baseline HEP initiated Goal status: INITIAL   LONG TERM GOALS: Target date: 11/26/2022  Pt will increase FOTO to at least 70 to demonstrate significant improvement in function at home and work related to back pain  Baseline: 10/12/22: 59 Goal status: INITIAL  2.  Pt will decrease worst hip pain by at least 3 points on the NPRS in order to demonstrate clinically significant reduction in back pain. Baseline: 10/12/22: 9/10 pain at worst Goal status: INITIAL  3.  Pt will tolerate sitting > 1 hour or more without reproduction of L hip pain as needed for spending leisure time with family and longer drives     Baseline: 10/12/22: Pain and limitation with sitting > 30 min Goal status: INITIAL  4.  Patient will have no pain with loaded hip hinge mimicking demands of leaning over his son's bed to complete caregiving activities and aid with treatments  Baseline: 10/12/22: significant pain with trunk flexion and leaning over his son's bed to care for him Goal status: INITIAL  5.  Patient will perform overhead squat without significant movement deviations and completion of squat to 90 deg or below as needed for completion of transfers to floor to perform mechanic duties and for functional lifting and grabbing low-lying items  Baseline: 10/12/22:  Goal status: INITIAL   PLAN: PT FREQUENCY: 1-2x/week  PT DURATION: 6 weeks  PLANNED INTERVENTIONS:  Therapeutic exercises, Therapeutic activity, Neuromuscular re-education, Balance training, Patient/Family education, Self Care, Joint mobilization, Joint manipulation, Dry Needling, Electrical stimulation, Spinal manipulation, Spinal mobilization, Cryotherapy, Moist heat, Taping, Traction, Ultrasound, Manual therapy, and Re-evaluation.  PLAN FOR NEXT SESSION: Check hip extension and abduction MMTs next visit. Manual therapy with use of Mulligan techniques for hip mobility, STM/DTM and IASTM for posterolateral gluteal soft tissue; consider DN. Progress hip mobility as tolerated in pain-free ranges and progress toward strengthening as able.    Gabriel English, PT, DPT #[id number]  Gabriel English, PT 10/15/2022, 6:01 AM

## 2022-10-15 ENCOUNTER — Encounter: Payer: Self-pay | Admitting: Physical Therapy

## 2022-10-19 ENCOUNTER — Encounter: Payer: Self-pay | Admitting: Physical Therapy

## 2022-10-19 ENCOUNTER — Ambulatory Visit: Payer: Medicaid Other | Admitting: Physical Therapy

## 2022-10-19 DIAGNOSIS — M25652 Stiffness of left hip, not elsewhere classified: Secondary | ICD-10-CM

## 2022-10-19 DIAGNOSIS — M6281 Muscle weakness (generalized): Secondary | ICD-10-CM

## 2022-10-19 DIAGNOSIS — M25552 Pain in left hip: Secondary | ICD-10-CM

## 2022-10-19 DIAGNOSIS — R262 Difficulty in walking, not elsewhere classified: Secondary | ICD-10-CM

## 2022-10-19 NOTE — Addendum Note (Signed)
Addended by: Valentina Gu T on: 10/19/2022 01:22 PM   Modules accepted: Orders

## 2022-10-19 NOTE — Therapy (Unsigned)
OUTPATIENT PHYSICAL THERAPY TREATMENT NOTE   Patient Name: Gabriel English MRN: [id number] DOB:06/21/1981, 41 y.o., male Today's Date: 10/19/2022   END OF SESSION:   PT End of Session - 10/19/22 1701     Visit Number 2    Number of Visits 13    Date for PT Re-Evaluation 11/24/22    Authorization Type [employer] 2023    Authorization Time Period VL based on auth    Progress Note Due on Visit 10    PT Start Time 1713    PT Stop Time 1757    PT Time Calculation (min) 44 min    Activity Tolerance Patient tolerated treatment well    Behavior During Therapy WFL for tasks assessed/performed             Past Medical History:  Diagnosis Date   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 No pertinent surgical history. There are no problems to display for this patient.  PCP: Cyndi Bender, PA-C   REFERRING PROVIDER: Elon Alas, PA-C   REFERRING DIAG: [phone] (ICD-10-CM) - Left hip pain   THERAPY DIAG:  Pain in left hip  Difficulty in walking, not elsewhere classified  Stiffness of left hip, not elsewhere classified  Muscle weakness (generalized)  Rationale for Evaluation and Treatment Rehabilitation  PERTINENT HISTORY: Patient is a 41 year old male with primary complaint of L hip pain with insidious atraumatic onset. Patient reports deep pain along lateral thigh/C region along hip and pain along L groin as well. Pt has Hx of leukemia; pt is in remission for leukemia. Pt had stroke last year and was able to recover well with use of TPA. Patient reports some intermittent LLE paresthesias. Pt has son with special needs and has to lean over his bed when helping with treatment; pt reports notable  pain with sustained forward bending/trunk flexion and with sitting - he often has to shift his weight in sitting to opposite hip.   PAIN:    Pain Intensity: Present: 5-6/10, Best: 3/10, Worst: 9/10 Pain location: R lateral hip/C-shape, R groin Pain Quality: intermittent, stabbing, "pulling" something, squeezing or "something in a bind" Radiating: No  Numbness/Tingling: Yes, tingling intermittently down to toes Focal Weakness: Yes, intermittent sensation of buckling Aggravating factors: Pain with trunk flexion/leaning over, sitting with weight on L side, pivoting over LLE, prolonged sitting/driving Relieving factors: offloading L hip, shifting weight to R side;  24-hour pain behavior: worse in AM How long can you sit: 30 minutes History of prior back injury, pain, surgery, or therapy: No    Imaging: Yes  LEFT HIP, 3 VIEWS WITH VISUALIZATION OF THE PELVIS History: M25.552 Pain in left hip Findings and impression: 1.No acute abnormality 2.Specifically, no significant degenerative change 3. 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 Pt reports having worse pain yesterday without known cause. Patient reports deep pain along anterolateral L hip. Pt reports doing well with initial HEP.    PAIN:  Are you having pain? Yes: NPRS scale: 5/10 Pain location: L hip, deep in lateral hip, TFL/hip flexor region    OBJECTIVE: (objective measures completed at initial evaluation unless otherwise dated)   Patient Surveys  FOTO 59, predicted outcome score of 27   Cognition Patient is oriented to person, place, and time.  Recent memory is intact.  Remote memory is intact.  Attention span and concentration are intact.  Expressive speech is intact.  Patient's fund of knowledge is within normal limits for educational level.                          Gross Musculoskeletal Assessment Tremor: None Bulk: Normal Tone: Normal No visible step-off along spinal column, no signs of scoliosis   GAIT: Distance walked: 50 ft Assistive device utilized: None Level of assistance: Complete Independence Comments: Mild decreased weight shift to LLE   Squat: pt does not demonstrate asymmetric weight shift, but he limits ROM 0-60 deg and demonstrates limited combined hip/knee flexion. Mild loss of neutral thoracolumbar spine with increased kyphosis at bottom of squat.     Posture: Lumbar lordosis: WNL Iliac crest height: Equal bilaterally Lumbar lateral shift: Negative   AROM     AROM (Normal range in degrees) AROM   Lumbar    Flexion (65) 50%*  Extension (30) 100%  Right lateral flexion (25) 100%  Left lateral flexion (25) 100%  Right rotation (30) 100%*  Left rotation (30) 100%*         Hip Right Left  Flexion (125)   90  Extension (15)   10  Abduction (40)   45  Adduction       Internal Rotation (45)   30  External Rotation (45)   45         (* = pain; Blank rows = not tested)   LE MMT: MMT (out  of 5) Right    Left    Hip flexion 5 4+  Hip extension  5  4*  Hip abduction  5  4+*  Hip adduction      Hip internal rotation 5 4-*  Hip external rotation 5 4*  Knee flexion 5 4*  Knee extension 5 4+  Ankle dorsiflexion      Ankle plantarflexion      Ankle inversion      Ankle eversion      (* = pain; Blank rows = not tested)   Sensation Grossly intact to light touch throughout bilateral LEs as determined by testing dermatomes L2-S2. Proprioception, stereognosis, and hot/cold testing deferred on this date.   Reflexes Deferred   Muscle Length Hamstrings: R: Negative L: Negative Ely (quadriceps): R: Positive L: Positive Thomas (hip flexors): R: Not examined L: Positive Ober: R: Not examined L: Not examined   Palpation Location Right Left         Lumbar paraspinals   0  Quadratus Lumborum   0  Gluteus Maximus   1  Gluteus Medius   1  Deep hip external rotators   1  PSIS   0  Fortin's Area (SIJ)   0  Greater Trochanter   0  TFL   1  (Blank rows = not tested) Graded on 0-4 scale (0 = no pain, 1 = pain, 2 = pain with wincing/grimacing/flinching, 3 = pain with withdrawal, 4 = unwilling to allow palpation)   Passive Accessory Intervertebral Motion Pt denies reproduction of back pain or concordant hip pain with CPA L1-L5 and UPA bilaterally L1-L5.    Special Tests Lumbar Radiculopathy and Discogenic: Centralization and Peripheralization (SN 92, -LR 0.12): Not done Slump (SN 83, -LR 0.32): R: Negative L: Negative SLR (SN 92, -LR 0.29): R: Negative L:  Negative     Hip: FABER (SN 81): R: Negative L: Positive FADIR (SN 94): R: Not examined L: Positive Hip scour (SN 50): R: Negative L: Positive        TODAY'S TREATMENT: DATE: 10/19/2022    Manual Therapy - for symptom modulation, soft tissue sensitivity and mobility, joint mobility, ROM   L hip PROM to check for motion loss and reproduction of symptoms; 2 x 1 minute LLE lateral distraction with mobilization belt, intermittent  pull x 10 sec; performed for 5 minutes at various angles  -best tolerance with hip flexion to 80 deg and slight ER MWM with conjunct hip flexion and IR; performed x 5 repetitions for each STM/DTM L gluteus medius, L deep hip ERs, L TFL/hip flexors x 10 minutes    Trigger Point Dry Needling (TDN), unbilled Education performed with patient regarding potential benefit of TDN. Reviewed precautions and risks with patient. Reviewed special precautions/risks over lung fields which include pneumothorax. Reviewed signs and symptoms of pneumothorax and advised pt to go to ER immediately if these symptoms develop advise them of dry needling treatment. Extensive time spent with pt to ensure full understanding of TDN risks. Pt provided verbal consent to treatment. TDN performed to  L TFL, L distal iliopsoas, and L gluteus medius with 0.30 x 60 single needle placements with local twitch response (LTR). Pistoning technique utilized. Pt able to access [facility] hip flexion in quadruped post-needling, pain is acutely increased from to 7/10.     Therapeutic Exercise - for improved soft tissue flexibility and extensibility as needed for ROM, hip complex mobility as needed for movement require to perform transfers and functional activities  Update for hip MMTs (abduction and extension)  Quadruped rock back 1x10 Half kneel hip flexor stretch with contralateral sidebend for TFL emphasis; 3x30 sec hold  -Airex on ground to reduce contact pressure on knee   PATIENT EDUCATION: Discussed post-DN expectations and anticipated resolution of post-treatment soreness within 24-48 hours. Reviewed HEP and discussed addition of half-kneel stretch for hip flexors.        PATIENT EDUCATION:  Education details: see above for patient education details Person educated: Patient Education method: Explanation, Demonstration, and Handouts Education comprehension: verbalized understanding and returned demonstration     HOME EXERCISE  PROGRAM:  Access Code: [email or web address] URL: [email or web address] Date: 10/12/2022 Prepared by: Valentina Gu   Exercises - Modified Marcello Moores Stretch  - 2 x daily - 7 x weekly - 3 sets - 30sec hold - Supine Piriformis Stretch with Foot on Ground  - 2 x daily - 7 x weekly - 3 sets - 30sec hold     ASSESSMENT:   CLINICAL IMPRESSION:  Hip MMTs were updated today with expected deficits noted in hip abductor and extensor strength on L side relative to 5/5 strength on R. Patient has ongoing moderate pain in L lateral and anterior hip with intermittent severe pain. Long-leg distraction is attempted with mild discomfort versus relief. Utilized Mulligan mobilization techniques today which enabled access to larger hip ROM; pt tolerates lateral distraction well in mid-range hip flexion. Utilized DN for quick pain modulation effect for sensitized/taut tissues along L hip flexors and L glutes. Sound twitch is obtained with expected post-treatment soreness. Pt does feel that his pain level is 2 points higher post-session. Will monitor how pt's symptoms evolve as post-DN soreness improves and modify intervention as needed with successive visits. Patient has remaining deficits in decreased gluteal and quad/hamstrings strength, decreased L hip flexion/extension/IR ROM, quadriceps and hip flexor tightness, tenderness to palpation along R posterolateral gluteal mm and TFL. Patient will benefit from continued skilled therapeutic intervention to address the above deficits as needed for improved function and QoL.       OBJECTIVE IMPAIRMENTS: difficulty walking, decreased ROM, decreased strength, hypomobility, impaired flexibility, and pain.    ACTIVITY LIMITATIONS: carrying, lifting, bending, sitting, squatting, and transfers   PARTICIPATION LIMITATIONS: cleaning, driving, community activity, occupation, and caring for son with special needs   PERSONAL FACTORS: Past/current experiences and 3+  comorbidities: HTN, leukemia in remission and response to cancer treatment, Hx of stroke)  are also affecting patient's functional outcome.    REHAB POTENTIAL: Good   CLINICAL DECISION MAKING: Evolving/moderate complexity   EVALUATION COMPLEXITY: Moderate     GOALS: Goals reviewed with patient? Yes   SHORT TERM GOALS: Target date: 11/03/2022   Pt will be independent with HEP in order to improve strength and decrease back pain to improve pain-free function at home and work. Baseline: 10/12/22: Baseline HEP initiated Goal status: INITIAL     LONG TERM GOALS: Target date: 11/26/2022   Pt will increase FOTO to at least 70 to demonstrate significant improvement in function at home and work related to back pain  Baseline: 10/12/22: 59 Goal status: INITIAL   2.  Pt will decrease worst hip pain by at least 3 points on the NPRS in order to demonstrate clinically significant reduction in back pain. Baseline: 10/12/22: 9/10 pain at worst Goal status: INITIAL   3.  Pt will tolerate sitting > 1 hour or more without reproduction of L hip pain as needed for spending leisure time with family and longer drives  Baseline: 10/12/22: Pain and limitation with sitting > 30 min Goal status: INITIAL   4.  Patient will have no pain with loaded hip hinge mimicking demands of leaning over his son's bed to complete caregiving activities and aid with treatments  Baseline: 10/12/22: significant pain with trunk flexion and leaning over his son's bed to care for him Goal status: INITIAL   5.  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  Goal status: INITIAL     PLAN: PT FREQUENCY: 1-2x/week   PT DURATION: 6 weeks    PLANNED INTERVENTIONS: Therapeutic exercises, Therapeutic activity, Neuromuscular re-education, Balance training, Patient/Family education, Self Care, Joint mobilization, Joint manipulation, Dry Needling, Electrical stimulation, Spinal manipulation, Spinal mobilization, Cryotherapy, Moist heat, Taping, Traction, Ultrasound, Manual therapy, and Re-evaluation.   PLAN FOR NEXT SESSION: Manual therapy with use of Mulligan techniques for hip mobility, STM/DTM and IASTM for posterolateral gluteal soft tissue. Progress hip mobility as tolerated in pain-free ranges and progress toward strengthening as able.  F/u on response to treatment from today.       Valentina Gu, PT, DPT #[id number]  Eilleen Kempf, PT 10/19/2022, 5:13 PM

## 2022-10-26 ENCOUNTER — Ambulatory Visit: Payer: Medicaid Other | Admitting: Physical Therapy

## 2022-10-26 ENCOUNTER — Encounter: Payer: Self-pay | Admitting: Physical Therapy

## 2022-10-26 DIAGNOSIS — R262 Difficulty in walking, not elsewhere classified: Secondary | ICD-10-CM

## 2022-10-26 DIAGNOSIS — M25552 Pain in left hip: Secondary | ICD-10-CM | POA: Diagnosis not present

## 2022-10-26 DIAGNOSIS — M25652 Stiffness of left hip, not elsewhere classified: Secondary | ICD-10-CM

## 2022-10-26 DIAGNOSIS — M6281 Muscle weakness (generalized): Secondary | ICD-10-CM

## 2022-10-26 NOTE — Therapy (Unsigned)
OUTPATIENT PHYSICAL THERAPY TREATMENT NOTE   Patient Name: Gabriel English MRN: [id number] DOB:12-05-1980, 41 y.o., male Today's Date: 10/26/2022   END OF SESSION:   PT End of Session - 10/26/22 1151     Visit Number 3    Number of Visits 13    Date for PT Re-Evaluation 11/24/22    Authorization Type [employer] 2023    Authorization Time Period VL based on auth    Progress Note Due on Visit 10    PT Start Time 1150    PT Stop Time 1232    PT Time Calculation (min) 42 min    Activity Tolerance Patient tolerated treatment well    Behavior During Therapy WFL for tasks assessed/performed              Past Medical History:  Diagnosis Date   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 There are no problems to display for this patient.  PCP: Cyndi Bender, PA-C   REFERRING PROVIDER: Elon Alas, PA-C   REFERRING DIAG: [id number] (ICD-10-CM) - Left hip pain   THERAPY DIAG:  Pain in left hip  Difficulty in walking, not elsewhere classified  Stiffness of left hip, not elsewhere classified  Muscle weakness (generalized)  Rationale for Evaluation and Treatment Rehabilitation  PERTINENT HISTORY: Patient is a 41 year old male with primary complaint of L hip pain with insidious atraumatic onset. Patient reports deep pain along lateral thigh/C region along hip and pain along L groin as well. Pt has Hx of leukemia; pt is in remission for leukemia. Pt had stroke last year and was able to recover well with use of TPA. Patient reports some intermittent LLE paresthesias. Pt has son with special needs and has to lean over his bed when helping with treatment; pt reports notable pain with  sustained forward bending/trunk flexion and with sitting - he often has to shift his weight in sitting to opposite hip.   PAIN:    Pain Intensity: Present: 5-6/10, Best: 3/10, Worst: 9/10 Pain location: R lateral hip/C-shape, R groin Pain Quality: intermittent, stabbing, "pulling" something, squeezing or "something in a bind" Radiating: No  Numbness/Tingling: Yes, tingling intermittently down to toes Focal Weakness: Yes, intermittent sensation of buckling Aggravating factors: Pain with trunk flexion/leaning over, sitting with weight on L side, pivoting over LLE, prolonged sitting/driving Relieving factors: offloading L hip, shifting weight to R side;  24-hour pain behavior: worse in AM How long can you sit: 30 minutes History of prior back injury, pain, surgery, or therapy: No    Imaging: Yes  LEFT HIP, 3 VIEWS WITH VISUALIZATION OF THE PELVIS History: M25.552 Pain in left hip Findings and impression: 1.No acute abnormality 2.Specifically, no significant degenerative change 3. 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 Patient reports 5/10 NPRS at arrival. Patient reports having about 2 days of soreness last visit. Patient reports doing well earlier this week, but it hurts more this AM than it did the last 2 days. Patient reports compliance with HEP. He feels that intervention last visit did help, though he did have acute discomfort during manual therapy.    PAIN:  Are you having pain? Yes: NPRS scale: 5/10 Pain location: L hip, deep in lateral hip, TFL/hip flexor region    OBJECTIVE: (objective measures completed at initial evaluation unless otherwise dated)   Patient Surveys  FOTO 59, predicted outcome score of 37   Cognition Patient is oriented to person, place, and time.  Recent memory is intact.  Remote memory is intact.  Attention span and concentration are intact.  Expressive speech is intact.  Patient's fund of knowledge is within normal limits for educational level.                          Gross Musculoskeletal Assessment Tremor: None Bulk: Normal Tone: Normal No visible step-off along spinal column, no signs of scoliosis   GAIT: Distance walked: 50 ft Assistive device utilized: None Level of assistance: Complete Independence Comments: Mild decreased weight shift to LLE   Squat: pt does not demonstrate asymmetric weight shift, but he limits ROM 0-60 deg and demonstrates limited combined hip/knee flexion. Mild loss of neutral thoracolumbar spine with increased kyphosis at bottom of squat.     Posture: Lumbar lordosis: WNL Iliac crest height: Equal bilaterally Lumbar lateral shift: Negative   AROM     AROM (Normal range in degrees) AROM   Lumbar    Flexion (65) 50%*  Extension (30) 100%  Right lateral flexion (25) 100%  Left lateral flexion (25) 100%  Right rotation (30) 100%*  Left rotation (30) 100%*         Hip Right Left  Flexion (125)   90  Extension  (15)   10  Abduction (40)   45  Adduction  Internal Rotation (45)   30  External Rotation (45)   45         (* = pain; Blank rows = not tested)   LE MMT: MMT (out of 5) Right   Left    Hip flexion 5 4+  Hip extension  5  4*  Hip abduction  5  4+*  Hip adduction      Hip internal rotation 5 4-*  Hip external rotation 5 4*  Knee flexion 5 4*  Knee extension 5 4+  Ankle dorsiflexion      Ankle plantarflexion      Ankle inversion      Ankle eversion      (* = pain; Blank rows = not tested)   Sensation Grossly intact to light touch throughout bilateral LEs as determined by testing dermatomes L2-S2. Proprioception, stereognosis, and hot/cold testing deferred on this date.   Reflexes Deferred   Muscle Length Hamstrings: R: Negative L: Negative Ely (quadriceps): R: Positive L: Positive Thomas (hip flexors): R: Not examined L: Positive Ober: R: Not examined L: Not examined   Palpation Location Right Left         Lumbar paraspinals   0  Quadratus Lumborum   0  Gluteus Maximus   1  Gluteus Medius   1  Deep hip external rotators   1  PSIS   0  Fortin's Area (SIJ)   0  Greater Trochanter   0  TFL   1  (Blank rows = not tested) Graded on 0-4 scale (0 = no pain, 1 = pain, 2 = pain with wincing/grimacing/flinching, 3 = pain with withdrawal, 4 = unwilling to allow palpation)   Passive Accessory Intervertebral Motion Pt denies reproduction of back pain or concordant hip pain with CPA L1-L5 and UPA bilaterally L1-L5.    Special Tests Lumbar Radiculopathy and Discogenic: Centralization and Peripheralization (SN 92, -LR 0.12): Not done Slump (SN 83, -LR 0.32): R: Negative L: Negative SLR (SN 92, -LR 0.29): R: Negative L:  Negative     Hip: FABER (SN 81): R: Negative L: Positive FADIR (SN 94): R: Not examined L: Positive Hip scour (SN 50): R: Negative L: Positive        TODAY'S TREATMENT: DATE: 10/26/2022    Manual Therapy - for symptom modulation, soft  tissue sensitivity and mobility, joint mobility, ROM   L hip PROM to check for motion loss and reproduction of symptoms; 1 x 1 minute LLE lateral distraction with mobilization belt, intermittent pull x 10 sec; performed for 5 minutes at various angles MWM with conjunct hip flexion and IR; performed x 10 repetitions for each STM and IASTM with Theraband roller along hip flexors/proximal quadriceps x 10 minutes    *not today* Trigger Point Dry Needling (TDN), unbilled Education performed with patient regarding potential benefit of TDN. Reviewed precautions and risks with patient. Reviewed special precautions/risks over lung fields which include pneumothorax. Reviewed signs and symptoms of pneumothorax and advised pt to go to ER immediately if these symptoms develop advise them of dry needling treatment. Extensive time spent with pt to ensure full understanding of TDN risks. Pt provided verbal consent to treatment. TDN performed to  L TFL, L distal iliopsoas, and L gluteus medius with 0.30 x 60 single needle placements with local twitch response (LTR). Pistoning technique utilized.     Therapeutic Exercise - for improved soft tissue flexibility and extensibility as needed for ROM, hip complex mobility as needed for movement require to  perform transfers and functional activities  Quadruped rock back 1x10 Sidelying clamshell; 2x10 (modified ROM, 50% range) Supine bridge; knee flexion 100 deg to improve comfort; 2x10   PATIENT EDUCATION: HEP update and review   *not today* Half kneel hip flexor stretch with contralateral sidebend for TFL emphasis; 3x30 sec hold      PATIENT EDUCATION:  Education details: see above for patient education details Person educated: Patient Education method: Explanation, Demonstration, and Handouts Education comprehension: verbalized understanding and returned demonstration     HOME EXERCISE PROGRAM:  Access Code: [email or web address] URL:  [email or web address] Date: 10/27/2022 Prepared by: Valentina Gu  Exercises - Modified [place] [employer]  - 2 x daily - 7 x weekly - 3 sets - 30sec hold - Supine Piriformis Stretch with Foot on Ground  - 2 x daily - 7 x weekly - 3 sets - 30sec hold - Half Kneeling Hip Flexor Stretch with Sidebend  - 2 x daily - 7 x weekly - 3 sets - 30sec hold - Clamshell  - 1 x daily - 7 x weekly - 2 sets - 10 reps - 1 sec hold - Supine Bridge  - 1 x daily - 7 x weekly - 2 sets - 10 reps - 1sec hold     ASSESSMENT:   CLINICAL IMPRESSION:  Patient elects to hold on dry needling today due to returning to work. He does have improved tolerance of hip flexion and IR today. Tolerance of PROM is improved with use of Mulligan belt coxofemoral lateral distraction. Pt has primary region of sensitivity along L hip flexors and "deep" pain in L hip joint that is more difficult to localize. We added hip abductor and extensor isotonics today in limited ROM with pt tolerating these exercises well with specific modifications made (clamshell in limited ROM, bridge with higher degree of knee flexion to limit sensation of "pull" on hip flexors). Patient has remaining deficits in decreased gluteal and quad/hamstrings strength, decreased L hip flexion/extension/IR ROM, quadriceps and hip flexor tightness, tenderness to palpation along R posterolateral gluteal mm and TFL. Patient will benefit from continued skilled therapeutic intervention to address the above deficits as needed for improved function and QoL.       OBJECTIVE IMPAIRMENTS: difficulty walking, decreased ROM, decreased strength, hypomobility, impaired flexibility, and pain.    ACTIVITY LIMITATIONS: carrying, lifting, bending, sitting, squatting, and transfers   PARTICIPATION LIMITATIONS: cleaning, driving, community activity, occupation, and caring for son with special needs   PERSONAL FACTORS: Past/current experiences and 3+ comorbidities: HTN,  leukemia in remission and response to cancer treatment, Hx of stroke)  are also affecting patient's functional outcome.    REHAB POTENTIAL: Good   CLINICAL DECISION MAKING: Evolving/moderate complexity   EVALUATION COMPLEXITY: Moderate     GOALS: Goals reviewed with patient? Yes   SHORT TERM GOALS: Target date: 11/03/2022   Pt will be independent with HEP in order to improve strength and decrease back pain to improve pain-free function at home and work. Baseline: 10/12/22: Baseline HEP initiated Goal status: INITIAL     LONG TERM GOALS: Target date: 11/26/2022   Pt will increase FOTO to at least 70 to demonstrate significant improvement in function at home and work related to back pain  Baseline: 10/12/22: 59 Goal status: INITIAL   2.  Pt will decrease worst hip pain by at least 3 points on the NPRS in order to demonstrate clinically significant reduction in back pain. Baseline: 10/12/22: 9/10 pain at worst Goal status: INITIAL   3.  Pt will tolerate sitting > 1 hour or more without reproduction of L hip pain as needed for spending leisure time with family and longer drives     Baseline: 10/12/22: Pain and limitation with sitting > 30 min Goal status: INITIAL   4.  Patient will have no pain with loaded hip hinge mimicking demands of leaning over his son's bed to complete caregiving activities and aid with treatments  Baseline: 10/12/22: significant pain with trunk flexion and leaning over his son's bed to care for him Goal status: INITIAL   5.  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  Goal status: INITIAL     PLAN: PT FREQUENCY: 1-2x/week   PT DURATION: 6 weeks   PLANNED INTERVENTIONS:  Therapeutic exercises, Therapeutic activity, Neuromuscular re-education, Balance training, Patient/Family education, Self Care, Joint mobilization, Joint manipulation, Dry Needling, Electrical stimulation, Spinal manipulation, Spinal mobilization, Cryotherapy, Moist heat, Taping, Traction, Ultrasound, Manual therapy, and Re-evaluation.   PLAN FOR NEXT SESSION: Manual therapy with use of Mulligan techniques for hip mobility, STM/DTM and IASTM for posterolateral gluteal soft tissue. Progress hip mobility as tolerated in pain-free ranges and progress toward strengthening as able.  F/u on response to treatment from today.       Valentina Gu, PT, DPT #[id number]  Eilleen Kempf, PT 10/26/2022, 11:51 AM

## 2022-11-02 ENCOUNTER — Ambulatory Visit: Payer: Medicaid Other | Attending: Surgery | Admitting: Physical Therapy

## 2022-11-02 DIAGNOSIS — M6281 Muscle weakness (generalized): Secondary | ICD-10-CM | POA: Diagnosis present

## 2022-11-02 DIAGNOSIS — M25652 Stiffness of left hip, not elsewhere classified: Secondary | ICD-10-CM

## 2022-11-02 DIAGNOSIS — R262 Difficulty in walking, not elsewhere classified: Secondary | ICD-10-CM | POA: Diagnosis present

## 2022-11-02 DIAGNOSIS — M25552 Pain in left hip: Secondary | ICD-10-CM | POA: Diagnosis not present

## 2022-11-02 NOTE — Therapy (Signed)
OUTPATIENT PHYSICAL THERAPY TREATMENT NOTE   Patient Name: Gabriel English MRN: [id number] DOB:21-Feb-1981, 42 y.o., male Today's Date: 11/03/2022   END OF SESSION:   PT End of Session - 11/03/22 1051     Visit Number 4    Number of Visits 13    Date for PT Re-Evaluation 11/24/22    Authorization Type [employer] 2023    Authorization Time Period VL based on auth    Progress Note Due on Visit 10    PT Start Time 1718    PT Stop Time 1808    PT Time Calculation (min) 50 min    Activity Tolerance Patient tolerated treatment well    Behavior During Therapy WFL for tasks assessed/performed               Past Medical History:  Diagnosis Date   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 There are no problems to display for this patient.  PCP: Cyndi Bender, PA-C   REFERRING PROVIDER: Elon Alas, PA-C   REFERRING DIAG: [phone] (ICD-10-CM) - Left hip pain   THERAPY DIAG:  Pain in left hip  Difficulty in walking, not elsewhere classified  Stiffness of left hip, not elsewhere classified  Muscle weakness (generalized)  Rationale for Evaluation and Treatment Rehabilitation  PERTINENT HISTORY: Patient is a 42 year old male with primary complaint of L hip pain with insidious atraumatic onset. Patient reports deep pain along lateral thigh/C region along hip and pain along L groin as well. Pt has Hx of leukemia; pt is in remission for leukemia. Pt had stroke last year and was able to recover well with use of TPA. Patient reports some intermittent LLE paresthesias. Pt has son with special needs and has to lean over his bed when helping with treatment; pt reports notable pain with  sustained forward bending/trunk flexion and with sitting - he often has to shift his weight in sitting to opposite hip.   PAIN:    Pain Intensity: Present: 5-6/10, Best: 3/10, Worst: 9/10 Pain location: R lateral hip/C-shape, R groin Pain Quality: intermittent, stabbing, "pulling" something, squeezing or "something in a bind" Radiating: No  Numbness/Tingling: Yes, tingling intermittently down to toes Focal Weakness: Yes, intermittent sensation of buckling Aggravating factors: Pain with trunk flexion/leaning over, sitting with weight on L side, pivoting over LLE, prolonged sitting/driving Relieving factors: offloading L hip, shifting weight to R side;  24-hour pain behavior: worse in AM How long can you sit: 30 minutes History of prior back injury, pain, surgery, or therapy: No    Imaging: Yes  LEFT HIP, 3 VIEWS WITH VISUALIZATION OF THE PELVIS History: M25.552 Pain in left hip Findings and impression: 1.No acute abnormality 2.Specifically, no significant degenerative change 3. 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 Patient reports having bad pain experience for 3 days. He reports difficulty with walking and moving L lower limb. He is concerned that PT may not help and that his condition could get worse. Pt held on HEP overall since last follow-up. Pt reports pain primarily in L groin region; he denies posterolateral/posterior gluteal pain this evening.    PAIN:  Are you having pain? Yes: NPRS scale: 8/10 Pain location: L hip, medial hip flexor/groin at arrival today    OBJECTIVE: (objective measures completed at initial evaluation unless otherwise dated)   Patient Surveys  FOTO 59, predicted outcome score of 53   Cognition Patient is oriented to person, place, and time.  Recent memory is intact.  Remote memory is intact.  Attention span and concentration are intact.  Expressive speech is intact.  Patient's fund of knowledge is within normal limits for educational level.                          Gross Musculoskeletal Assessment Tremor: None Bulk: Normal Tone: Normal No visible step-off along spinal column, no signs of scoliosis   GAIT: Distance walked: 50 ft Assistive device utilized: None Level of assistance: Complete Independence Comments: Mild decreased weight shift to LLE   Squat: pt does not demonstrate asymmetric weight shift, but he limits ROM 0-60 deg and demonstrates limited combined hip/knee flexion. Mild loss of neutral thoracolumbar spine with increased kyphosis at bottom of squat.     Posture: Lumbar lordosis: WNL Iliac crest height: Equal bilaterally Lumbar lateral shift: Negative   AROM     AROM (Normal range in degrees) AROM   Lumbar    Flexion (65) 50%*  Extension (30) 100%  Right lateral flexion (25) 100%  Left lateral flexion (25) 100%  Right rotation (30) 100%*  Left rotation (30) 100%*         Hip Right Left   Flexion (125)   90  Extension (15)   10  Abduction (40)   45  Adduction       Internal Rotation (45)   30  External Rotation (45)   45         (* = pain; Blank rows = not tested)   LE MMT: MMT (out of 5) Right   Left    Hip flexion 5 4+  Hip extension  5  4*  Hip abduction  5  4+*  Hip adduction      Hip internal rotation 5 4-*  Hip external rotation 5 4*  Knee flexion 5 4*  Knee extension 5 4+  Ankle dorsiflexion      Ankle plantarflexion      Ankle inversion      Ankle eversion      (* = pain; Blank rows = not tested)   Sensation Grossly intact to light touch throughout bilateral LEs as determined by testing dermatomes L2-S2. Proprioception, stereognosis, and hot/cold testing deferred on this date.   Reflexes Deferred   Muscle Length Hamstrings: R: Negative L: Negative [place] (quadriceps): R: Positive L: Positive Thomas (hip flexors): R: Not examined L: Positive Ober: R: Not examined L: Not examined   Palpation Location Right Left         Lumbar paraspinals   0  Quadratus Lumborum   0  Gluteus Maximus   1  Gluteus Medius   1  Deep hip external rotators   1  PSIS   0  Fortin's Area (SIJ)   0  Greater Trochanter   0  TFL   1  (Blank rows = not tested) Graded on 0-4 scale (0 = no pain, 1 = pain, 2 = pain with wincing/grimacing/flinching, 3 = pain with withdrawal, 4 = unwilling to allow palpation)   Passive Accessory Intervertebral Motion Pt denies reproduction of back pain or concordant hip pain with CPA L1-L5 and UPA bilaterally L1-L5.    Special Tests Lumbar Radiculopathy and Discogenic: Centralization and Peripheralization (SN 92, -LR 0.12): Not done Slump (SN 83, -LR 0.32): R: Negative L: Negative SLR (SN 92, -LR 0.29): R: Negative L:  Negative     Hip: FABER (SN 81): R: Negative L: Positive FADIR (SN 94): R: Not examined L: Positive Hip scour (SN 50): R: Negative L: Positive        TODAY'S TREATMENT: DATE: 11/03/2022    Manual Therapy - for  symptom modulation, soft tissue sensitivity and mobility, joint mobility, ROM   Trial of long-leg distraction for analgesic effect, mild "pull" is reported without notable relief or pain modulation; x 1 minute with 10-second intermittent pulls  L hip PROM to check for motion loss and reproduction of symptoms; 1 x 1 minute  -limited tolerance of L hip flexion and IR today LLE lateral distraction with Mulligan mobilization belt with pt in hooklying position; intermittent holds x 10 sec; performed x 5 minutes STM and TPR aong R pectineus with hip flexors placed in shortened position with pillows under knees; STM x 8 minutes and 3-minute trial of TPR MWM with conjunct IR; performed x 10 repetitions  -difficulty with tolerating hip flexion > 90 deg on L   *not today* IASTM with Theraband roller along hip flexors/proximal quadriceps x 10 minutes Trigger Point Dry Needling (TDN), unbilled Education performed with patient regarding potential benefit of TDN. Reviewed precautions and risks with patient. Reviewed special precautions/risks over lung fields which include pneumothorax. Reviewed signs and symptoms of pneumothorax and advised pt to go to ER immediately if these symptoms develop advise them of dry needling  treatment. Extensive time spent with pt to ensure full understanding of TDN risks. Pt provided verbal consent to treatment. TDN performed to  L TFL, L distal iliopsoas, and L gluteus medius with 0.30 x 60 single needle placements with local twitch response (LTR). Pistoning technique utilized.     Therapeutic Exercise - for improved soft tissue flexibility and extensibility as needed for ROM, hip complex mobility as needed for movement require to perform transfers and functional activities   Half kneel hip flexor stretch with contralateral sidebend for TFL emphasis; 3x30 sec hold Seated hip abduction and adduction isometrics; 1x10, 5 sec hold in either direction  PATIENT EDUCATION:  Discussed at length typical post-treatment expectations and modifying program if experiencing significant pain that persists longer than expected timeframe for DOMS. Discussed positioning strategies and palliative care at home as needed. HEP update and review  *on hold* Sidelying clamshell; 2x10 (modified ROM, 50% range) Supine bridge; knee flexion 100 deg to improve comfort; 2x10   *not today* Quadruped rock back 1x10    Cold pack (unbilled) - for anti-inflammatory and analgesic effect as needed for reduced pain and improved ability to participate in active PT intervention, along L hip flexor in supine with 2 pillows under knee for pt to rest in loose-packed position for L hip, x 5 minutes     PATIENT EDUCATION:  Education details: see above for patient education details Person educated: Patient Education method: Explanation, Demonstration, and Handouts Education comprehension: verbalized understanding and returned demonstration     HOME EXERCISE PROGRAM:  Access Code: [email or web address] URL: [email or web address] Date: 10/27/2022 Prepared by: Valentina Gu  Exercises - Modified [employer]  - 2 x daily - 7 x weekly - 3 sets - 30sec hold - Supine Piriformis Stretch with Foot on Ground  - 2 x daily - 7 x weekly - 3 sets - 30sec hold - Half Kneeling Hip Flexor Stretch with Sidebend  - 2 x daily - 7 x weekly - 3 sets - 30sec hold      ASSESSMENT:   CLINICAL IMPRESSION:  Patient was able to perform supine isotonics (bridging and sidelying clamshell in partial range) last visit with modifications made to exercise techniques. Additional work on [employer] and soft tissue work to anterior hip complex was completed similar to previous visits, though pt had not yet added lying exercises noted above to this program. He reports significant pain through the day last Wednesday with ongoing pain for 3 days. 8/10 NPRS at arrival today with apparent decreased  stance time on LLE. Focused on symptom modulation strategies today and manual therapy focused on sensitized hip flexor region/anterior hip complex. Pt has been able to control symptoms fairly well with OTC anti-inflammatory medicine (NSAIDs); utilized cold pack for anti-inflammatory effect today post-treatment and modified patient's program to refrain from supine strengthening drills added last visit and focus on gentle stretches and isometrics. Discussed with patient activity modifications, positioning strategies, and use of cold pack at home as needed for pain control. Patient has remaining deficits in decreased gluteal and quad/hamstrings strength, decreased L hip flexion/extension/IR ROM, quadriceps and hip flexor tightness, tenderness to palpation along R posterolateral gluteal mm and TFL. Patient will benefit from continued skilled therapeutic intervention to address the above deficits as needed for improved function and QoL.       OBJECTIVE IMPAIRMENTS: difficulty walking, decreased ROM, decreased strength, hypomobility, impaired flexibility, and pain.    ACTIVITY LIMITATIONS: carrying, lifting, bending, sitting, squatting, and transfers   PARTICIPATION LIMITATIONS: cleaning,  driving, community activity, occupation, and caring for son with special needs   PERSONAL FACTORS: Past/current experiences and 3+ comorbidities: HTN, leukemia in remission and response to cancer treatment, Hx of stroke)  are also affecting patient's functional outcome.    REHAB POTENTIAL: Good   CLINICAL DECISION MAKING: Evolving/moderate complexity   EVALUATION COMPLEXITY: Moderate     GOALS: Goals reviewed with patient? Yes   SHORT TERM GOALS: Target date: 11/03/2022   Pt will be independent with HEP in order to improve strength and decrease back pain to improve pain-free function at home and work. Baseline: 10/12/22: Baseline HEP initiated Goal status: INITIAL     LONG TERM GOALS: Target date: 11/26/2022    Pt will increase FOTO to at least 70 to demonstrate significant improvement in function at home and work related to back pain  Baseline: 10/12/22: 59 Goal status: INITIAL   2.  Pt will decrease worst hip pain by at least 3 points on the NPRS in order to demonstrate clinically significant reduction in back pain. Baseline: 10/12/22: 9/10 pain at worst Goal status: INITIAL   3.  Pt will tolerate sitting > 1 hour or more without reproduction of L hip pain as needed for spending leisure time with family and longer drives     Baseline: 10/12/22: Pain and limitation with sitting > 30 min Goal status: INITIAL   4.  Patient will have no pain with loaded hip hinge mimicking demands of leaning over his son's bed to complete caregiving activities and aid with treatments  Baseline: 10/12/22: significant pain with trunk flexion and leaning over his son's bed to care for him Goal status: INITIAL   5.  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  Goal status: INITIAL     PLAN: PT FREQUENCY: 1-2x/week   PT DURATION: 6 weeks   PLANNED INTERVENTIONS: Therapeutic exercises, Therapeutic activity, Neuromuscular re-education, Balance training, Patient/Family education, Self Care, Joint mobilization, Joint manipulation, Dry Needling, Electrical stimulation, Spinal manipulation, Spinal mobilization, Cryotherapy, Moist heat, Taping, Traction, Ultrasound, Manual therapy, and Re-evaluation.   PLAN FOR NEXT SESSION: Manual therapy with use of Mulligan techniques for hip mobility, STM/DTM for sensitized hip flexor/groin region. Progress hip mobility as tolerated in pain-free ranges and progress with isometrics followed by isotonics as  tolerated by pt.  F/u on flare-up reported over this past week and modify patient's program prn.       Valentina Gu, PT, DPT #[id number]  Eilleen Kempf, PT 11/03/2022, 10:51 AM

## 2022-11-03 ENCOUNTER — Encounter: Payer: Self-pay | Admitting: Physical Therapy

## 2022-11-08 ENCOUNTER — Ambulatory Visit: Payer: Medicaid Other | Admitting: Physical Therapy

## 2022-11-10 ENCOUNTER — Ambulatory Visit: Payer: Medicaid Other | Admitting: Physical Therapy

## 2022-11-10 DIAGNOSIS — M6281 Muscle weakness (generalized): Secondary | ICD-10-CM

## 2022-11-10 DIAGNOSIS — M25552 Pain in left hip: Secondary | ICD-10-CM

## 2022-11-10 DIAGNOSIS — M25652 Stiffness of left hip, not elsewhere classified: Secondary | ICD-10-CM

## 2022-11-10 DIAGNOSIS — R262 Difficulty in walking, not elsewhere classified: Secondary | ICD-10-CM

## 2022-11-10 NOTE — Therapy (Signed)
OUTPATIENT PHYSICAL THERAPY TREATMENT NOTE   Patient Name: Gabriel English MRN: [id number] DOB:1981-07-04, 42 y.o., male Today's Date: 11/15/2022   END OF SESSION:   PT End of Session - 11/15/22 1357     Visit Number 5    Number of Visits 13    Date for PT Re-Evaluation 11/24/22    Authorization Type [employer] 2023    Authorization Time Period VL based on auth    Progress Note Due on Visit 10    PT Start Time 1651    PT Stop Time 1732    PT Time Calculation (min) 41 min    Activity Tolerance Patient tolerated treatment well    Behavior During Therapy WFL for tasks assessed/performed                Past Medical History:  Diagnosis Date   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 No pertinent surgical history. There are no problems to display for this patient.  PCP: Cyndi Bender, PA-C   REFERRING PROVIDER: Elon Alas, PA-C   REFERRING DIAG: [phone] (ICD-10-CM) - Left hip pain   THERAPY DIAG:  Pain in left hip  Difficulty in walking, not elsewhere classified  Stiffness of left hip, not elsewhere classified  Muscle weakness (generalized)  Rationale for Evaluation and Treatment Rehabilitation  PERTINENT HISTORY: Patient is a 42 year old male with primary complaint of L hip pain with insidious atraumatic onset. Patient reports deep pain along lateral thigh/C region along hip and pain along L groin as well. Pt has Hx of leukemia; pt is in remission for leukemia. Pt had stroke last year and was able to recover well with use of TPA. Patient reports some intermittent LLE paresthesias. Pt has son with special needs and has to lean over his bed when helping with treatment; pt reports  notable pain with sustained forward bending/trunk flexion and with sitting - he often has to shift his weight in sitting to opposite hip.   PAIN:    Pain Intensity: Present: 5-6/10, Best: 3/10, Worst: 9/10 Pain location: R lateral hip/C-shape, R groin Pain Quality: intermittent, stabbing, "pulling" something, squeezing or "something in a bind" Radiating: No  Numbness/Tingling: Yes, tingling intermittently down to toes Focal Weakness: Yes, intermittent sensation of buckling Aggravating factors: Pain with trunk flexion/leaning over, sitting with weight on L side, pivoting over LLE, prolonged sitting/driving Relieving factors: offloading L hip, shifting weight to R side;  24-hour pain behavior: worse in AM How long can you sit: 30 minutes History of prior back injury, pain, surgery, or therapy: No    Imaging: Yes  LEFT HIP, 3 VIEWS WITH VISUALIZATION OF THE PELVIS History: M25.552 Pain in left hip Findings and impression: 1.No acute abnormality 2.Specifically, no significant degenerative change 3. 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 He reports having a hard time ambulating that night after having to cancel his PT appointment. Pt reports pain with direct pressure onto proximal adductor region. Pt reports sleeping in recliner Tuesday night to be able to get to sleep.    PAIN:  Are you having pain? Yes: NPRS scale: 8/10 Pain location: L hip, medial hip flexor/groin at arrival today    OBJECTIVE: (objective measures completed at initial evaluation unless otherwise dated)   Patient Surveys  FOTO 59, predicted outcome score of 64   Cognition Patient is oriented to person, place, and time.  Recent memory is intact.  Remote memory is intact.  Attention span and concentration are intact.  Expressive speech is intact.  Patient's fund of knowledge is within normal limits for educational level.                          Gross Musculoskeletal Assessment Tremor: None Bulk: Normal Tone: Normal No visible step-off along spinal column, no signs of scoliosis   GAIT: Distance walked: 50 ft Assistive device utilized: None Level of assistance: Complete Independence Comments: Mild decreased weight shift to LLE   Squat: pt does not demonstrate asymmetric weight shift, but he limits ROM 0-60 deg and demonstrates limited combined hip/knee flexion. Mild loss of neutral thoracolumbar spine with increased kyphosis at bottom of squat.     Posture: Lumbar lordosis: WNL Iliac crest height: Equal bilaterally Lumbar lateral shift: Negative   AROM     AROM (Normal range in degrees) AROM   Lumbar    Flexion (65) 50%*  Extension (30) 100%  Right lateral flexion (25) 100%  Left lateral flexion (25) 100%  Right rotation (30) 100%*  Left rotation (30) 100%*         Hip Right Left  Flexion (125)   90  Extension (15)   10  Abduction (40)   45  Adduction       Internal Rotation (45)   30   External Rotation (45)   45         (* =  pain; Blank rows = not tested)   LE MMT: MMT (out of 5) Right   Left    Hip flexion 5 4+  Hip extension  5  4*  Hip abduction  5  4+*  Hip adduction      Hip internal rotation 5 4-*  Hip external rotation 5 4*  Knee flexion 5 4*  Knee extension 5 4+  Ankle dorsiflexion      Ankle plantarflexion      Ankle inversion      Ankle eversion      (* = pain; Blank rows = not tested)   Sensation Grossly intact to light touch throughout bilateral LEs as determined by testing dermatomes L2-S2. Proprioception, stereognosis, and hot/cold testing deferred on this date.   Reflexes Deferred   Muscle Length Hamstrings: R: Negative L: Negative Ely (quadriceps): R: Positive L: Positive Thomas (hip flexors): R: Not examined L: Positive Ober: R: Not examined L: Not examined   Palpation Location Right Left         Lumbar paraspinals   0  Quadratus Lumborum   0  Gluteus Maximus   1  Gluteus Medius   1  Deep hip external rotators   1  PSIS   0  Fortin's Area (SIJ)   0  Greater Trochanter   0  TFL   1  (Blank rows = not tested) Graded on 0-4 scale (0 = no pain, 1 = pain, 2 = pain with wincing/grimacing/flinching, 3 = pain with withdrawal, 4 = unwilling to allow palpation)   Passive Accessory Intervertebral Motion Pt denies reproduction of back pain or concordant hip pain with CPA L1-L5 and UPA bilaterally L1-L5.    Special Tests Lumbar Radiculopathy and Discogenic: Centralization and Peripheralization (SN 92, -LR 0.12): Not done Slump (SN 83, -LR 0.32): R: Negative L: Negative SLR (SN 92, -LR 0.29): R: Negative L:  Negative     Hip: FABER (SN 81): R: Negative L: Positive FADIR (SN 94): R: Not examined L: Positive Hip scour (SN 50): R: Negative L: Positive        TODAY'S TREATMENT: DATE: 11/10/2022    Manual Therapy - for symptom modulation, soft tissue sensitivity and mobility, joint mobility, ROM   Trial of long-leg  distraction for analgesic effect, mild "pull" is reported without notable relief or pain modulation; x 1 minute with 10-second intermittent pulls  L hip PROM to check for motion loss and reproduction of symptoms; 2 x 1 minute  -limited tolerance of L hip flexion and IR today STM along R pectineus with hip flexors placed in shortened position with pillows under knees; x 15 minutes    *not today* MWM with conjunct IR; performed x 10 repetitions  -difficulty with tolerating hip flexion > 90 deg on L LLE lateral distraction with Mulligan mobilization belt with pt in hooklying position; intermittent holds x 10 sec; performed x 5 minutes IASTM with Theraband roller along hip flexors/proximal quadriceps x 10 minutes Trigger Point Dry Needling (TDN), unbilled    Trigger Point Dry Needling (unbilled time) Education performed with patient regarding potential benefit and mild adverse effects of TDN at previous session. Pt provided verbal consent to treatment. TDN performed to  L distal iliopsoas and L proximal RF with 0.30 x 60 single needle placements with local twitch response (LTR). Pistoning technique utilized.    Therapeutic Exercise - for improved soft tissue flexibility and extensibility as needed for ROM, hip complex mobility as needed for movement require to perform  transfers and functional activities  Quadruped rock back 1x10 Bent-knee fallout for hip ER/ABD ROM; 2x10 in hooklying position; Active hip IR in sitting, foam roller between knees; 1x10  -mild groin pain at end-range IR, stopped after first set   PATIENT EDUCATION: discussed post-treatment expectations following DN and continued isometrics and gentle stretching program at home   *on hold* Sidelying clamshell; 2x10 (modified ROM, 50% range) Supine bridge; knee flexion 100 deg to improve comfort; 2x10   *not today* Half kneel hip flexor stretch with contralateral sidebend for TFL emphasis; 3x30 sec hold Seated hip  abduction and adduction isometrics; 1x10, 5 sec hold in either direction Cold pack (unbilled) - for anti-inflammatory and analgesic effect as needed for reduced pain and improved ability to participate in active PT intervention, along L hip flexor in supine with 2 pillows under knee for pt to rest in loose-packed position for L hip, x 5 minutes       PATIENT EDUCATION:  Education details: see above for patient education details Person educated: Patient Education method: Explanation, Demonstration, and Handouts Education comprehension: verbalized understanding and returned demonstration     HOME EXERCISE PROGRAM:  Access Code: [email or web address] URL: [email or web address] Date: 10/27/2022 Prepared by: Valentina Gu  Exercises - Modified Marcello Moores Stretch  - 2 x daily - 7 x weekly - 3 sets - 30sec hold - Supine Piriformis Stretch with Foot on Ground  - 2 x daily - 7 x weekly - 3 sets - 30sec hold - Half Kneeling Hip Flexor Stretch with Sidebend  - 2 x daily - 7 x weekly - 3 sets - 30sec hold      ASSESSMENT:   CLINICAL IMPRESSION:  Patient fortunately has relatively improved pain status compared to significantly heightened pain reported 2 visits ago. Patient demonstrates improved hip PROM into flexion, ER, and IR. Pt has primary region of sensitivity and pain along R medial hip flexor region near distal iliopsoas and pectineus. Dry needling was utilized today to address taut/tender TrP in this region with mild reproduction of symptoms during treatment. Pt tolerates mobility work on table well with exception of ongoing discomfort with active hip IR. Reviewed at length activity modifications for work to reduce likelihood of flare-up during work week. Patient has remaining deficits in decreased gluteal and quad/hamstrings strength, decreased L hip flexion/extension/IR ROM, quadriceps and hip flexor tightness, tenderness to palpation along R posterolateral gluteal mm and TFL. Patient will  benefit from continued skilled therapeutic intervention to address the above deficits as needed for improved function and QoL.       OBJECTIVE IMPAIRMENTS: difficulty walking, decreased ROM, decreased strength, hypomobility, impaired flexibility, and pain.    ACTIVITY LIMITATIONS: carrying, lifting, bending, sitting, squatting, and transfers   PARTICIPATION LIMITATIONS: cleaning, driving, community activity, occupation, and caring for son with special needs   PERSONAL FACTORS: Past/current experiences and 3+ comorbidities: HTN, leukemia in remission and response to cancer treatment, Hx of stroke)  are also affecting patient's functional outcome.    REHAB POTENTIAL: Good   CLINICAL DECISION MAKING: Evolving/moderate complexity   EVALUATION COMPLEXITY: Moderate     GOALS: Goals reviewed with patient? Yes   SHORT TERM GOALS: Target date: 11/03/2022   Pt will be independent with HEP in order to improve strength and decrease back pain to improve pain-free function at home and work. Baseline: 10/12/22: Baseline HEP initiated Goal status: INITIAL     LONG TERM GOALS: Target date: 11/26/2022   Pt will increase FOTO to at least 70 to demonstrate  significant improvement in function at home and work related to back pain  Baseline: 10/12/22: 59 Goal status: INITIAL   2.  Pt will decrease worst hip pain by at least 3 points on the NPRS in order to demonstrate clinically significant reduction in back pain. Baseline: 10/12/22: 9/10 pain at worst Goal status: INITIAL   3.  Pt will tolerate sitting > 1 hour or more without reproduction of L hip pain as needed for spending leisure time with family and longer drives     Baseline: 10/12/22: Pain and limitation with sitting > 30 min Goal status: INITIAL   4.  Patient will have no pain with loaded hip hinge mimicking demands of leaning over his son's bed to complete caregiving activities and aid with treatments  Baseline: 10/12/22: significant  pain with trunk flexion and leaning over his son's bed to care for him Goal status: INITIAL   5.  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  Goal status: INITIAL     PLAN: PT FREQUENCY: 1-2x/week   PT DURATION: 6 weeks   PLANNED INTERVENTIONS: Therapeutic exercises, Therapeutic activity, Neuromuscular re-education, Balance training, Patient/Family education, Self Care, Joint mobilization, Joint manipulation, Dry Needling, Electrical stimulation, Spinal manipulation, Spinal mobilization, Cryotherapy, Moist heat, Taping, Traction, Ultrasound, Manual therapy, and Re-evaluation.   PLAN FOR NEXT SESSION: Manual therapy with use of Mulligan techniques for hip mobility, STM/DTM for sensitized hip flexor/groin region. Progress hip mobility as tolerated in pain-free ranges and progress with isometrics followed by isotonics as tolerated by pt.  F/u on response to dry needling.     Valentina Gu, PT, DPT #[id number]  Eilleen Kempf, PT 11/15/2022, 1:58 PM

## 2022-11-15 ENCOUNTER — Ambulatory Visit: Payer: Medicaid Other | Admitting: Physical Therapy

## 2022-11-15 ENCOUNTER — Encounter: Payer: Self-pay | Admitting: Physical Therapy

## 2022-11-15 ENCOUNTER — Encounter: Payer: Medicaid Other | Admitting: Physical Therapy

## 2022-11-15 DIAGNOSIS — M25552 Pain in left hip: Secondary | ICD-10-CM | POA: Diagnosis not present

## 2022-11-15 DIAGNOSIS — M6281 Muscle weakness (generalized): Secondary | ICD-10-CM

## 2022-11-15 DIAGNOSIS — R262 Difficulty in walking, not elsewhere classified: Secondary | ICD-10-CM

## 2022-11-15 DIAGNOSIS — M25652 Stiffness of left hip, not elsewhere classified: Secondary | ICD-10-CM

## 2022-11-15 NOTE — Therapy (Signed)
OUTPATIENT PHYSICAL THERAPY TREATMENT NOTE   Patient Name: Gabriel English MRN: [id number] DOB:05-28-1981, 42 y.o., male Today's Date: 11/17/2022   END OF SESSION:   PT End of Session - 11/17/22 1534     Visit Number 6    Number of Visits 13    Date for PT Re-Evaluation 11/24/22    Authorization Type [employer] 2023    Authorization Time Period VL based on auth    Progress Note Due on Visit 10    PT Start Time 1646    PT Stop Time 1730    PT Time Calculation (min) 44 min    Activity Tolerance Patient tolerated treatment well    Behavior During Therapy WFL for tasks assessed/performed                 Past Medical History:  Diagnosis Date   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 There are no problems to display for this patient.  PCP: Cyndi Bender, PA-C   REFERRING PROVIDER: Elon Alas, PA-C   REFERRING DIAG: [phone] (ICD-10-CM) - Left hip pain   THERAPY DIAG:  Pain in left hip  Difficulty in walking, not elsewhere classified  Stiffness of left hip, not elsewhere classified  Muscle weakness (generalized)  Rationale for Evaluation and Treatment Rehabilitation  PERTINENT HISTORY: Patient is a 42 year old male with primary complaint of L hip pain with insidious atraumatic onset. Patient reports deep pain along lateral thigh/C region along hip and pain along L groin as well. Pt has Hx of leukemia; pt is in remission for leukemia. Pt had stroke last year and was able to recover well with use of TPA. Patient reports some intermittent LLE paresthesias. Pt has son with special needs and has to lean over his bed when helping with treatment; pt reports notable pain  with sustained forward bending/trunk flexion and with sitting - he often has to shift his weight in sitting to opposite hip.   PAIN:    Pain Intensity: Present: 5-6/10, Best: 3/10, Worst: 9/10 Pain location: R lateral hip/C-shape, R groin Pain Quality: intermittent, stabbing, "pulling" something, squeezing or "something in a bind" Radiating: No  Numbness/Tingling: Yes, tingling intermittently down to toes Focal Weakness: Yes, intermittent sensation of buckling Aggravating factors: Pain with trunk flexion/leaning over, sitting with weight on L side, pivoting over LLE, prolonged sitting/driving Relieving factors: offloading L hip, shifting weight to R side;  24-hour pain behavior: worse in AM How long can you sit: 30 minutes History of prior back injury, pain, surgery, or therapy: No    Imaging: Yes  LEFT HIP, 3 VIEWS WITH VISUALIZATION OF THE PELVIS History: M25.552 Pain in left hip Findings and impression: 1.No acute abnormality 2.Specifically, no significant degenerative change 3. 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 He reports difficulty with walking at that time. He reports having secondary pop with minimal pain after that. He reports relatively lower pain this week compared to last week with notable flare-up at that time. Pt does report that his symptoms are intermittently lower, but they do increase following work. Pt reports pain primarily along L medial hip flexor region at arrival.    PAIN:  Are you having pain? Yes: NPRS scale: 3-4/10 Pain location: L hip, medial hip flexor/groin at arrival today    OBJECTIVE: (objective measures completed at initial evaluation unless otherwise dated)   Patient Surveys  FOTO 59, predicted outcome score of 93   Cognition Patient is oriented to person, place, and time.  Recent memory is intact.  Remote memory is intact.  Attention span and concentration are intact.  Expressive speech is intact.  Patient's fund of knowledge is within normal limits for educational level.                          Gross Musculoskeletal Assessment Tremor: None Bulk: Normal Tone: Normal No visible step-off along spinal column, no signs of scoliosis   GAIT: Distance walked: 50 ft Assistive device utilized: None Level of assistance: Complete Independence Comments: Mild decreased weight shift to LLE   Squat: pt does not demonstrate asymmetric weight shift, but he limits ROM 0-60 deg and demonstrates limited combined hip/knee flexion. Mild loss of neutral thoracolumbar spine with increased kyphosis at bottom of squat.     Posture: Lumbar lordosis: WNL Iliac crest height: Equal bilaterally Lumbar lateral shift: Negative   AROM     AROM (Normal range in degrees) AROM   Lumbar    Flexion (65) 50%*  Extension (30) 100%  Right lateral flexion (25) 100%  Left lateral flexion (25) 100%  Right rotation (30) 100%*  Left rotation (30) 100%*          Hip Right Left  Flexion (125)   90  Extension (15)   10  Abduction (40)   45  Adduction       Internal Rotation (45)   30  External Rotation (45)   45         (* = pain; Blank rows = not tested)   LE MMT: MMT (out of 5) Right   Left    Hip flexion 5 4+  Hip extension  5  4*  Hip abduction  5  4+*  Hip adduction      Hip internal rotation 5 4-*  Hip external rotation 5 4*  Knee flexion 5 4*  Knee extension 5 4+  Ankle dorsiflexion      Ankle plantarflexion      Ankle inversion      Ankle eversion      (* = pain; Blank rows = not tested)   Sensation Grossly intact to light touch throughout bilateral LEs as determined by testing dermatomes L2-S2. Proprioception, stereognosis, and hot/cold testing deferred on this date.   Reflexes Deferred   Muscle Length Hamstrings: R: Negative L: Negative Ely (quadriceps): R: Positive L: Positive Thomas (hip flexors): R: Not examined L: Positive Ober: R: Not examined L: Not examined   Palpation Location Right Left         Lumbar paraspinals   0  Quadratus Lumborum   0  Gluteus Maximus   1  Gluteus Medius   1  Deep hip external rotators   1  PSIS   0  Fortin's Area (SIJ)   0  Greater Trochanter   0  TFL   1  (Blank rows = not tested) Graded on 0-4 scale (0 = no pain, 1 = pain, 2 = pain with wincing/grimacing/flinching, 3 = pain with withdrawal, 4 = unwilling to allow palpation)   Passive Accessory Intervertebral Motion Pt denies reproduction of back pain or concordant hip pain with CPA L1-L5 and UPA bilaterally L1-L5.    Special Tests Lumbar Radiculopathy and Discogenic: Centralization and Peripheralization (SN 92, -LR 0.12): Not done Slump (SN 83, -LR 0.32): R: Negative L: Negative SLR (SN 92, -LR 0.29): R: Negative L:  Negative     Hip: FABER (SN 81): R: Negative L: Positive FADIR (SN 94): R: Not examined L: Positive Hip scour (SN 50): R: Negative L: Positive        TODAY'S TREATMENT: DATE:  11/15/2022     Manual Therapy - for symptom modulation, soft tissue sensitivity and mobility, joint mobility, ROM   In supine: L hip PROM to check for motion loss and reproduction of symptoms;x 1 minute  -limited tolerance of L hip flexion and IR today STM along R pectineus with hip flexors placed in shortened position with pillows under knees; x 15 minutes   LLE lateral distraction with Mulligan mobilization belt with pt in hooklying position; intermittent holds x 10 sec; performed x 3 minutes MWM, hip distraction with conjunct hip flexion and IR; x10 each   *not today* Trial of long-leg distraction for analgesic effect, mild "pull" is reported without notable relief or pain modulation; x 1 minute with 10-second intermittent pulls MWM with conjunct IR; performed x 10 repetitions  -difficulty with tolerating hip flexion > 90 deg on L IASTM with Theraband roller along hip flexors/proximal quadriceps x 10 minutes Trigger Point Dry Needling (TDN), unbilled    Trigger Point Dry Needling (unbilled time) Education performed with patient regarding potential benefit and mild adverse effects of TDN at previous session. Pt provided verbal consent to treatment. TDN performed to  L adductor magnus x 2, 0.30 x  60 single needle placements with local twitch response (LTR). Pistoning technique utilized.    Therapeutic Exercise - for improved soft tissue flexibility and extensibility as needed for ROM, hip complex mobility as needed for movement require to perform transfers and functional activities  Bent-knee fallout for hip ER/ABD ROM; 1x10 in hooklying position; Hip flexor alternating isometric; 2x10 with each LE, alt R/L Butterfly stretch; 2x30 sec Quadruped rock back 1x10   PATIENT EDUCATION: HEP update and review    *on hold* Sidelying clamshell; 2x10 (modified ROM, 50% range) Supine bridge; knee flexion 100 deg to improve comfort; 2x10   *not today* Active hip IR in sitting, foam  roller between knees; 1x10  -mild groin pain at end-range IR, stopped after first set Half kneel hip flexor stretch with contralateral sidebend for TFL emphasis; 3x30 sec hold Seated hip abduction and adduction isometrics; 1x10, 5 sec hold in either direction Cold pack (unbilled) - for anti-inflammatory and analgesic effect as needed for reduced pain and improved ability to participate in active PT intervention, along L hip flexor in supine with 2 pillows under knee for pt to rest in loose-packed position for L hip, x 5 minutes       PATIENT EDUCATION:  Education details: see above for patient education details Person educated: Patient Education method: Explanation, Demonstration, and Handouts Education comprehension: verbalized understanding and returned demonstration     HOME EXERCISE PROGRAM:  Access Code: [email or web address] URL: [email or web address] Date: 10/27/2022 Prepared by: Valentina Gu  Exercises - Modified [place] [employer]  - 2 x daily - 7 x weekly - 3 sets - 30sec hold - Supine Piriformis Stretch with Foot on Ground  - 2 x daily - 7 x weekly - 3 sets - 30sec hold - Half Kneeling Hip Flexor Stretch with Sidebend  - 2 x daily - 7 x weekly - 3 sets - 30sec hold      ASSESSMENT:   CLINICAL IMPRESSION:  Patient does have decreased NPRS relative to last week, though this may reflect regression to mean versus treatment effect. Patient does exhibit improved tolerance of hip flexion and IR. Patient does have continued notable c/o pain that is intermittently heightened with prolonged weightbearing activity and repeated flexion. We will progress slowly with isotonics/strengthening given notable flare-up with attempted clamshells/bridges. Patient has remaining deficits in decreased gluteal and quad/hamstrings strength, decreased L hip flexion/extension/IR ROM, quadriceps and hip flexor tightness, tenderness to palpation along R posterolateral gluteal mm and TFL. Patient will  benefit from continued skilled therapeutic intervention to address the above deficits as needed for improved function and QoL.       OBJECTIVE IMPAIRMENTS: difficulty walking, decreased ROM, decreased strength, hypomobility, impaired flexibility, and pain.    ACTIVITY LIMITATIONS: carrying, lifting, bending, sitting, squatting, and transfers   PARTICIPATION LIMITATIONS: cleaning, driving, community activity, occupation, and caring for son with special needs   PERSONAL FACTORS: Past/current experiences and 3+ comorbidities: HTN, leukemia in remission and response to cancer treatment, Hx of stroke)  are also affecting patient's functional outcome.    REHAB POTENTIAL: Good   CLINICAL DECISION MAKING: Evolving/moderate complexity   EVALUATION COMPLEXITY: Moderate     GOALS: Goals reviewed with patient? Yes   SHORT TERM GOALS: Target date: 11/03/2022   Pt will be independent with HEP in order to improve strength and decrease back pain to improve pain-free function at home and work. Baseline: 10/12/22: Baseline HEP initiated Goal status: INITIAL     LONG TERM GOALS: Target date: 11/26/2022   Pt will  increase FOTO to at least 70 to demonstrate significant improvement in function at home and work related to back pain  Baseline: 10/12/22: 59 Goal status: INITIAL   2.  Pt will decrease worst hip pain by at least 3 points on the NPRS in order to demonstrate clinically significant reduction in back pain. Baseline: 10/12/22: 9/10 pain at worst Goal status: INITIAL   3.  Pt will tolerate sitting > 1 hour or more without reproduction of L hip pain as needed for spending leisure time with family and longer drives     Baseline: 10/12/22: Pain and limitation with sitting > 30 min Goal status: INITIAL   4.  Patient will have no pain with loaded hip hinge mimicking demands of leaning over his son's bed to complete caregiving activities and aid with treatments  Baseline: 10/12/22: significant  pain with trunk flexion and leaning over his son's bed to care for him Goal status: INITIAL   5.  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  Goal status: INITIAL     PLAN: PT FREQUENCY: 1-2x/week   PT DURATION: 6 weeks   PLANNED INTERVENTIONS: Therapeutic exercises, Therapeutic activity, Neuromuscular re-education, Balance training, Patient/Family education, Self Care, Joint mobilization, Joint manipulation, Dry Needling, Electrical stimulation, Spinal manipulation, Spinal mobilization, Cryotherapy, Moist heat, Taping, Traction, Ultrasound, Manual therapy, and Re-evaluation.   PLAN FOR NEXT SESSION: Manual therapy with use of Mulligan techniques for hip mobility, STM/DTM for sensitized hip flexor/groin region. Progress hip mobility as tolerated in pain-free ranges and progress with isometrics followed by isotonics as tolerated by pt.  F/u on response to dry needling.     Valentina Gu, PT, DPT #[id number]  Eilleen Kempf, PT 11/17/2022, 3:36 PM

## 2022-11-17 ENCOUNTER — Encounter: Payer: Self-pay | Admitting: Physical Therapy

## 2022-11-17 ENCOUNTER — Encounter: Payer: Medicaid Other | Admitting: Physical Therapy

## 2022-11-17 ENCOUNTER — Ambulatory Visit: Payer: Medicaid Other | Admitting: Physical Therapy

## 2022-11-17 DIAGNOSIS — M6281 Muscle weakness (generalized): Secondary | ICD-10-CM

## 2022-11-17 DIAGNOSIS — R262 Difficulty in walking, not elsewhere classified: Secondary | ICD-10-CM

## 2022-11-17 DIAGNOSIS — M25552 Pain in left hip: Secondary | ICD-10-CM | POA: Diagnosis not present

## 2022-11-17 DIAGNOSIS — M25652 Stiffness of left hip, not elsewhere classified: Secondary | ICD-10-CM

## 2022-11-17 NOTE — Therapy (Signed)
OUTPATIENT PHYSICAL THERAPY TREATMENT NOTE   Patient Name: Gabriel English MRN: [id number] DOB:1981-08-03, 42 y.o., male Today's Date: 11/17/22   END OF SESSION:   PT End of Session - 11/22/22 1220     Visit Number 7    Number of Visits 13    Date for PT Re-Evaluation 11/24/22    Authorization Type [employer] 2023    Authorization Time Period VL based on auth    Progress Note Due on Visit 10    PT Start Time 1646    PT Stop Time 1730    PT Time Calculation (min) 44 min    Activity Tolerance Patient tolerated treatment well    Behavior During Therapy WFL for tasks assessed/performed             Past Medical History:  Diagnosis Date   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 No pertinent surgical history. There are no problems to display for this patient.  PCP: Cyndi Bender, PA-C   REFERRING PROVIDER: Elon Alas, PA-C   REFERRING DIAG: [phone] (ICD-10-CM) - Left hip pain   THERAPY DIAG:  Pain in left hip  Difficulty in walking, not elsewhere classified  Stiffness of left hip, not elsewhere classified  Muscle weakness (generalized)  Rationale for Evaluation and Treatment Rehabilitation  PERTINENT HISTORY: Patient is a 42 year old male with primary complaint of L hip pain with insidious atraumatic onset. Patient reports deep pain along lateral thigh/C region along hip and pain along L groin as well. Pt has Hx of leukemia; pt is in remission for leukemia. Pt had stroke last year and was able to recover well with use of TPA. Patient reports some intermittent LLE paresthesias. Pt has son with special needs and has to lean over his bed when helping with treatment; pt reports notable  pain with sustained forward bending/trunk flexion and with sitting - he often has to shift his weight in sitting to opposite hip.   PAIN:    Pain Intensity: Present: 5-6/10, Best: 3/10, Worst: 9/10 Pain location: R lateral hip/C-shape, R groin Pain Quality: intermittent, stabbing, "pulling" something, squeezing or "something in a bind" Radiating: No  Numbness/Tingling: Yes, tingling intermittently down to toes Focal Weakness: Yes, intermittent sensation of buckling Aggravating factors: Pain with trunk flexion/leaning over, sitting with weight on L side, pivoting over LLE, prolonged sitting/driving Relieving factors: offloading L hip, shifting weight to R side;  24-hour pain behavior: worse in AM How long can you sit: 30 minutes History of prior back injury, pain, surgery, or therapy: No    Imaging: Yes  LEFT HIP, 3 VIEWS WITH VISUALIZATION OF THE PELVIS History: M25.552 Pain in left hip Findings and impression: 1.No acute abnormality 2.Specifically, no significant degenerative change 3. 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 He does have intermittent pain affecting L hip flexor/groin region medially.    PAIN:  Are you having pain? Minimal pain at arrival Pain location: L hip, medial hip flexor/groin at arrival today    OBJECTIVE: (objective measures completed at initial evaluation unless otherwise dated)   Patient Surveys  FOTO 59, predicted outcome score of 4   Cognition Patient is oriented to person, place, and time.  Recent memory is intact.  Remote memory is intact.  Attention span and concentration are intact.  Expressive speech is intact.  Patient's fund of knowledge is within normal limits for educational level.                          Gross Musculoskeletal Assessment Tremor: None Bulk: Normal Tone: Normal No visible step-off along spinal column, no signs of scoliosis   GAIT: Distance walked: 50 ft Assistive device utilized: None Level of assistance: Complete Independence Comments: Mild decreased weight shift to LLE   Squat: pt does not demonstrate asymmetric weight shift, but he limits ROM 0-60 deg and demonstrates limited combined hip/knee flexion. Mild loss of neutral thoracolumbar spine with increased kyphosis at bottom of squat.     Posture: Lumbar lordosis: WNL Iliac crest height: Equal bilaterally Lumbar lateral shift: Negative   AROM     AROM (Normal range in degrees) AROM   Lumbar    Flexion (65) 50%*  Extension (30) 100%  Right lateral flexion (25) 100%  Left lateral flexion (25) 100%  Right rotation (30) 100%*  Left rotation (30) 100%*         Hip Right Left  Flexion (125)   90  Extension (15)   10  Abduction (40)   45  Adduction       Internal Rotation (45)   30  External Rotation (45)   45         (* = pain; Blank rows = not tested)   LE MMT: MMT (out of 5) Right   Left    Hip flexion 5 4+  Hip extension  5  4*  Hip abduction  5   4+*  Hip adduction      Hip internal rotation 5 4-*  Hip external rotation 5 4*  Knee flexion 5 4*  Knee extension 5 4+  Ankle dorsiflexion      Ankle plantarflexion      Ankle inversion      Ankle eversion      (* = pain; Blank rows = not tested)   Sensation Grossly intact to light touch throughout bilateral LEs as determined by testing dermatomes L2-S2. Proprioception, stereognosis, and hot/cold testing deferred on this date.   Reflexes Deferred   Muscle Length Hamstrings: R: Negative L: Negative [place] (quadriceps): R: Positive L: Positive Thomas (hip flexors): R: Not examined L: Positive Ober: R: Not examined L: Not examined   Palpation Location Right Left         Lumbar paraspinals   0  Quadratus Lumborum   0  Gluteus Maximus   1  Gluteus Medius   1  Deep hip external rotators   1  PSIS   0  Fortin's Area (SIJ)   0  Greater Trochanter   0  TFL   1  (Blank rows = not tested) Graded on 0-4 scale (0 = no pain, 1 = pain, 2 = pain with wincing/grimacing/flinching, 3 = pain with withdrawal, 4 = unwilling to allow palpation)   Passive Accessory Intervertebral Motion Pt denies reproduction of back pain or concordant hip pain with CPA L1-L5 and UPA bilaterally L1-L5.    Special Tests Lumbar Radiculopathy and Discogenic: Centralization and Peripheralization (SN 92, -LR 0.12): Not done Slump (SN 83, -LR 0.32): R: Negative L: Negative SLR (SN 92, -LR 0.29): R: Negative L:  Negative     Hip: FABER (SN 81): R: Negative L: Positive FADIR (SN 94): R: Not examined L: Positive Hip scour (SN 50): R: Negative L: Positive        TODAY'S TREATMENT: DATE: 11/17/2022     Manual Therapy - for symptom modulation, soft tissue sensitivity and mobility, joint mobility, ROM   In supine: L hip PROM to check for motion loss and reproduction of symptoms;x 1 minute  -limited tolerance of L hip flexion and IR today STM along R pectineus with hip flexors placed in shortened position  with pillows under knees; x 15 minutes  TPR x 2 rounds with direct pressure onto R pectineus (2-3 mins for each rounds)  LLE lateral distraction with Mulligan mobilization belt with pt in hooklying position; intermittent holds x 10 sec; performed x 3 minutes MWM, hip distraction with conjunct hip flexion and IR; x10 each    *not today* Trial of long-leg distraction for analgesic effect, mild "pull" is reported without notable relief or pain modulation; x 1 minute with 10-second intermittent pulls MWM with conjunct IR; performed x 10 repetitions  -difficulty with tolerating hip flexion > 90 deg on L IASTM with Theraband roller along hip flexors/proximal quadriceps x 10 minutes Trigger Point Dry Needling (TDN), unbilled Education performed with patient regarding potential benefit and mild adverse effects of TDN at previous session. Pt provided verbal consent to treatment. TDN performed to  L adductor magnus x 2, 0.30 x 60 single needle placements with local twitch response (LTR). Pistoning technique utilized.       Therapeutic Exercise - for improved soft tissue flexibility and extensibility as needed for ROM, hip complex mobility as needed for movement require to perform transfers and functional activities  Butterfly  stretch; 2x30 sec Hip flexor alternating isometric; 2x10 with each LE, alt R/L  Quadruped rock back 1x10  Glute bridge; 2x8 (initiated with partial range and progressed to full range - hip extension to neutral)  Sidestep with Blue Tband superior to patellae; 4x D/B on blue agility ladder    PATIENT EDUCATION: HEP review. Discussed gradual progression of hip strengthening expected moving forward and continued activity modification for work.    *on hold* Sidelying clamshell; 2x10 (modified ROM, 50% range)  *not today* Bent-knee fallout for hip ER/ABD ROM; 1x10 in hooklying position; Active hip IR in sitting, foam roller between knees; 1x10  -mild groin pain at  end-range IR, stopped after first set Half kneel hip flexor stretch with contralateral sidebend for TFL emphasis; 3x30 sec hold Seated hip abduction and adduction isometrics; 1x10, 5 sec hold in either direction Cold pack (unbilled) - for anti-inflammatory and analgesic effect as needed for reduced pain and improved ability to participate in active PT intervention, along L hip flexor in supine with 2 pillows under knee for pt to rest in loose-packed position for L hip, x 5 minutes       PATIENT EDUCATION:  Education details: see above for patient education details Person educated: Patient Education method: Explanation, Demonstration, and Handouts Education comprehension: verbalized understanding and returned demonstration     HOME EXERCISE PROGRAM:  Access Code: [email or web address] URL: [email or web address] Date: 11/17/2022 Prepared by: Valentina Gu  Exercises - Modified [place] [employer]  - 2 x daily - 7 x weekly - 3 sets - 30sec hold - Supine Piriformis Stretch with Foot on Ground  - 2 x daily - 7 x weekly - 3 sets - 30sec hold - Half Kneeling Hip Flexor Stretch with Sidebend  - 2 x daily - 7 x weekly - 3 sets - 30sec hold - Supine Butterfly Groin Stretch  - 2 x daily - 7 x weekly - 3 sets - 30sec hold - Seated Hip Adduction Squeeze with Ball  - 2 x daily - 7 x weekly - 2 sets - 10 reps - 5sec hold - Hooklying Isometric Hip Abduction with Belt  - 2 x daily - 7 x weekly - 2 sets - 10 reps - 5sec hold      ASSESSMENT:   CLINICAL IMPRESSION:  Patient has minimal symptoms this evening relative to last 2 visits, but this could reflect variability in symptoms. We will continue to monitor condition with successive PT visits. Pt tolerates hip flexion and IR well today without significant groin pain. Pt is able to modestly progress with low impact gluteal strengthening drills with good tolerance in-session today. Patient has remaining deficits in decreased gluteal and quad/hamstrings  strength, decreased L hip flexion/extension/IR ROM, quadriceps and hip flexor tightness, tenderness to palpation along R posterolateral gluteal mm and TFL. Patient will benefit from continued skilled therapeutic intervention to address the above deficits as needed for improved function and QoL.       OBJECTIVE IMPAIRMENTS: difficulty walking, decreased ROM, decreased strength, hypomobility, impaired flexibility, and pain.    ACTIVITY LIMITATIONS: carrying, lifting, bending, sitting, squatting, and transfers   PARTICIPATION LIMITATIONS: cleaning, driving, community activity, occupation, and caring for son with special needs   PERSONAL FACTORS: Past/current experiences and 3+ comorbidities: HTN, leukemia in remission and response to cancer treatment, Hx of stroke)  are also affecting patient's functional outcome.    REHAB POTENTIAL: Good   CLINICAL DECISION MAKING: Evolving/moderate complexity   EVALUATION COMPLEXITY: Moderate     GOALS: Goals  reviewed with patient? Yes   SHORT TERM GOALS: Target date: 11/03/2022   Pt will be independent with HEP in order to improve strength and decrease back pain to improve pain-free function at home and work. Baseline: 10/12/22: Baseline HEP initiated Goal status: INITIAL     LONG TERM GOALS: Target date: 11/26/2022   Pt will increase FOTO to at least 70 to demonstrate significant improvement in function at home and work related to back pain  Baseline: 10/12/22: 59 Goal status: INITIAL   2.  Pt will decrease worst hip pain by at least 3 points on the NPRS in order to demonstrate clinically significant reduction in back pain. Baseline: 10/12/22: 9/10 pain at worst Goal status: INITIAL   3.  Pt will tolerate sitting > 1 hour or more without reproduction of L hip pain as needed for spending leisure time with family and longer drives     Baseline: 10/12/22: Pain and limitation with sitting > 30 min Goal status: INITIAL   4.  Patient will have no  pain with loaded hip hinge mimicking demands of leaning over his son's bed to complete caregiving activities and aid with treatments  Baseline: 10/12/22: significant pain with trunk flexion and leaning over his son's bed to care for him Goal status: INITIAL   5.  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  Goal status: INITIAL     PLAN: PT FREQUENCY: 1-2x/week   PT DURATION: 6 weeks   PLANNED INTERVENTIONS: Therapeutic exercises, Therapeutic activity, Neuromuscular re-education, Balance training, Patient/Family education, Self Care, Joint mobilization, Joint manipulation, Dry Needling, Electrical stimulation, Spinal manipulation, Spinal mobilization, Cryotherapy, Moist heat, Taping, Traction, Ultrasound, Manual therapy, and Re-evaluation.   PLAN FOR NEXT SESSION: Manual therapy with use of Mulligan techniques for hip mobility, STM/DTM for sensitized hip flexor/groin region. Progress hip mobility as tolerated in pain-free ranges and progress with isometrics followed by isotonics as tolerated by pt.      Valentina Gu, PT, DPT #[id number]  Eilleen Kempf, PT 11/22/2022, 12:20 PM

## 2022-11-22 ENCOUNTER — Ambulatory Visit: Payer: Medicaid Other | Admitting: Physical Therapy

## 2022-11-22 ENCOUNTER — Encounter: Payer: Medicaid Other | Admitting: Physical Therapy

## 2022-11-22 DIAGNOSIS — M25652 Stiffness of left hip, not elsewhere classified: Secondary | ICD-10-CM

## 2022-11-22 DIAGNOSIS — R262 Difficulty in walking, not elsewhere classified: Secondary | ICD-10-CM

## 2022-11-22 DIAGNOSIS — M25552 Pain in left hip: Secondary | ICD-10-CM | POA: Diagnosis not present

## 2022-11-22 DIAGNOSIS — M6281 Muscle weakness (generalized): Secondary | ICD-10-CM

## 2022-11-22 NOTE — Therapy (Signed)
OUTPATIENT PHYSICAL THERAPY TREATMENT NOTE   Patient Name: Gabriel English MRN: [id number] DOB:06-Nov-1980, 42 y.o., male Today's Date: 11/22/22   END OF SESSION:   PT End of Session - 11/24/22 1239     Visit Number 8    Number of Visits 13    Date for PT Re-Evaluation 11/24/22    Authorization Type [employer] 2023    Authorization Time Period VL based on auth    Progress Note Due on Visit 10    PT Start Time 1643    PT Stop Time 1727    PT Time Calculation (min) 44 min    Activity Tolerance Patient tolerated treatment well    Behavior During Therapy WFL for tasks assessed/performed              Past Medical History:  Diagnosis Date   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 No pertinent surgical history. There are no problems to display for this patient.  PCP: Cyndi Bender, PA-C   REFERRING PROVIDER: Elon Alas, PA-C   REFERRING DIAG: [phone] (ICD-10-CM) - Left hip pain   THERAPY DIAG:  Pain in left hip  Difficulty in walking, not elsewhere classified  Stiffness of left hip, not elsewhere classified  Muscle weakness (generalized)  Rationale for Evaluation and Treatment Rehabilitation  PERTINENT HISTORY: Patient is a 42 year old male with primary complaint of L hip pain with insidious atraumatic onset. Patient reports deep pain along lateral thigh/C region along hip and pain along L groin as well. Pt has Hx of leukemia; pt is in remission for leukemia. Pt had stroke last year and was able to recover well with use of TPA. Patient reports some intermittent LLE paresthesias. Pt has son with special needs and has to lean over his bed when helping with treatment; pt reports notable  pain with sustained forward bending/trunk flexion and with sitting - he often has to shift his weight in sitting to opposite hip.   PAIN:    Pain Intensity: Present: 5-6/10, Best: 3/10, Worst: 9/10 Pain location: R lateral hip/C-shape, R groin Pain Quality: intermittent, stabbing, "pulling" something, squeezing or "something in a bind" Radiating: No  Numbness/Tingling: Yes, tingling intermittently down to toes Focal Weakness: Yes, intermittent sensation of buckling Aggravating factors: Pain with trunk flexion/leaning over, sitting with weight on L side, pivoting over LLE, prolonged sitting/driving Relieving factors: offloading L hip, shifting weight to R side;  24-hour pain behavior: worse in AM How long can you sit: 30 minutes History of prior back injury, pain, surgery, or therapy: No    Imaging: Yes  LEFT HIP, 3 VIEWS WITH VISUALIZATION OF THE PELVIS History: M25.552 Pain in left hip Findings and impression: 1.No acute abnormality 2.Specifically, no significant degenerative change 3. 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 He reports having discomfort in back from completing accent wall in his home. Patient reports that this has since improved on its own. Pt reports no significant hip symptoms at arrival. Pt reports doing well with his HEP.    PAIN:  Are you having pain? Minimal pain at arrival Pain location: L hip, medial hip flexor/groin at arrival today    OBJECTIVE: (objective measures completed at initial evaluation unless otherwise dated)   Patient Surveys  FOTO 59, predicted outcome score of 51   Cognition Patient is oriented to person, place, and time.  Recent memory is intact.  Remote memory is intact.  Attention span and concentration are intact.  Expressive speech is intact.  Patient's fund of knowledge is within normal limits for educational level.                          Gross Musculoskeletal Assessment Tremor: None Bulk: Normal Tone: Normal No visible step-off along spinal column, no signs of scoliosis   GAIT: Distance walked: 50 ft Assistive device utilized: None Level of assistance: Complete Independence Comments: Mild decreased weight shift to LLE   Squat: pt does not demonstrate asymmetric weight shift, but he limits ROM 0-60 deg and demonstrates limited combined hip/knee flexion. Mild loss of neutral thoracolumbar spine with increased kyphosis at bottom of squat.     Posture: Lumbar lordosis: WNL Iliac crest height: Equal bilaterally Lumbar lateral shift: Negative   AROM     AROM (Normal range in degrees) AROM   Lumbar    Flexion (65) 50%*  Extension (30) 100%  Right lateral flexion (25) 100%  Left lateral flexion (25) 100%  Right rotation (30) 100%*  Left rotation (30) 100%*         Hip Right Left  Flexion (125)   90  Extension (15)   10  Abduction (40)   45  Adduction       Internal Rotation (45)   30  External Rotation (45)   45          (* =  pain; Blank rows = not tested)   LE MMT: MMT (out of 5) Right   Left    Hip flexion 5 4+  Hip extension  5  4*  Hip abduction  5  4+*  Hip adduction      Hip internal rotation 5 4-*  Hip external rotation 5 4*  Knee flexion 5 4*  Knee extension 5 4+  Ankle dorsiflexion      Ankle plantarflexion      Ankle inversion      Ankle eversion      (* = pain; Blank rows = not tested)   Sensation Grossly intact to light touch throughout bilateral LEs as determined by testing dermatomes L2-S2. Proprioception, stereognosis, and hot/cold testing deferred on this date.   Reflexes Deferred   Muscle Length Hamstrings: R: Negative L: Negative [place] (quadriceps): R: Positive L: Positive Thomas (hip flexors): R: Not examined L: Positive Ober: R: Not examined L: Not examined   Palpation Location Right Left         Lumbar paraspinals   0  Quadratus Lumborum   0  Gluteus Maximus   1  Gluteus Medius   1  Deep hip external rotators   1  PSIS   0  Fortin's Area (SIJ)   0  Greater Trochanter   0  TFL   1  (Blank rows = not tested) Graded on 0-4 scale (0 = no pain, 1 = pain, 2 = pain with wincing/grimacing/flinching, 3 = pain with withdrawal, 4 = unwilling to allow palpation)   Passive Accessory Intervertebral Motion Pt denies reproduction of back pain or concordant hip pain with CPA L1-L5 and UPA bilaterally L1-L5.    Special Tests Lumbar Radiculopathy and Discogenic: Centralization and Peripheralization (SN 92, -LR 0.12): Not done Slump (SN 83, -LR 0.32): R: Negative L: Negative SLR (SN 92, -LR 0.29): R: Negative L:  Negative     Hip: FABER (SN 81): R: Negative L: Positive FADIR (SN 94): R: Not examined L: Positive Hip scour (SN 50): R: Negative L: Positive        TODAY'S TREATMENT: DATE: 11/22/2022   Manual Therapy - for symptom modulation, soft tissue sensitivity and mobility, joint mobility, ROM   In supine: L hip PROM to check for motion loss and reproduction of  symptoms;x 1 minute  -limited tolerance of L hip flexion and IR today STM along R pectineus with hip flexors placed in shortened position with pillows under knees; x 3 minutes    *not today* TPR x 2 rounds with direct pressure onto R pectineus (2-3 mins for each rounds) LLE lateral distraction with Mulligan mobilization belt with pt in hooklying position; intermittent holds x 10 sec; performed x 3 minutes MWM, hip distraction with conjunct hip flexion and IR; x10 each  Trial of long-leg distraction for analgesic effect, mild "pull" is reported without notable relief or pain modulation; x 1 minute with 10-second intermittent pulls MWM with conjunct IR; performed x 10 repetitions  -difficulty with tolerating hip flexion > 90 deg on L IASTM with Theraband roller along hip flexors/proximal quadriceps x 10 minutes Trigger Point Dry Needling (TDN), unbilled Education performed with patient regarding potential benefit and mild adverse effects of TDN at previous session. Pt provided verbal consent to treatment. TDN performed to  L adductor magnus x 2, 0.30 x 60 single needle placements with local twitch response (LTR). Pistoning technique utilized.       Therapeutic Exercise - for improved soft tissue flexibility  and extensibility as needed for ROM, hip complex mobility as needed for movement require to perform transfers and functional activities  Quadruped rock back 1x10  Ambulance person in quadruped; 2x10  [place] in quadruped; 2x10  Hip flexor alternating isometric; reviewed for HEP  Glute bridge; 2x10   -full range without notable pain today  Sidestep with Blue Tband superior to patellae; 5x D/B on blue agility ladder   Total Gym squat with depth to tolerance, Level 22; 2x10  Unipedal stance on Airex; 3x30 sec  PATIENT EDUCATION: Discussed addition of hip flexor isometrics to home program. Discussed continued activity modification for work and expected progression of PT with  successive visits.    *on hold* Sidelying clamshell; 2x10 (modified ROM, 50% range)  *not today* Butterfly stretch; 2x30 sec Bent-knee fallout for hip ER/ABD ROM; 1x10 in hooklying position; Active hip IR in sitting, foam roller between knees; 1x10  -mild groin pain at end-range IR, stopped after first set Half kneel hip flexor stretch with contralateral sidebend for TFL emphasis; 3x30 sec hold Seated hip abduction and adduction isometrics; 1x10, 5 sec hold in either direction Cold pack (unbilled) - for anti-inflammatory and analgesic effect as needed for reduced pain and improved ability to participate in active PT intervention, along L hip flexor in supine with 2 pillows under knee for pt to rest in loose-packed position for L hip, x 5 minutes       PATIENT EDUCATION:  Education details: see above for patient education details Person educated: Patient Education method: Explanation, Demonstration, and Handouts Education comprehension: verbalized understanding and returned demonstration     HOME EXERCISE PROGRAM:  Access Code: [email or web address] URL: [email or web address] Date: 11/17/2022 Prepared by: Valentina Gu  Exercises - Modified [place] [employer]  - 2 x daily - 7 x weekly - 3 sets - 30sec hold - Supine Piriformis Stretch with Foot on Ground  - 2 x daily - 7 x weekly - 3 sets - 30sec hold - Half Kneeling Hip Flexor Stretch with Sidebend  - 2 x daily - 7 x weekly - 3 sets - 30sec hold - Supine Butterfly Groin Stretch  - 2 x daily - 7 x weekly - 3 sets - 30sec hold - Seated Hip Adduction Squeeze with Ball  - 2 x daily - 7 x weekly - 2 sets - 10 reps - 5sec hold - Hooklying Isometric Hip Abduction with Belt  - 2 x daily - 7 x weekly - 2 sets - 10 reps - 5sec hold      ASSESSMENT:   CLINICAL IMPRESSION:  Patient fortunately has minimal hip pain at this time. He did have fleeting back pain following completion of home remodeling project that was self-limiting.  Patient tolerates full hip PROM, deep squat on Total Gym (partial body weight), and hip abductor/ER/flexor loading without notable pain. Pt does have significantly improved activity tolerance and pain status relative to 2 weeks ago. Patient has remaining deficits in decreased gluteal and quad/hamstrings strength, decreased L hip flexion/extension/IR ROM, quadriceps and hip flexor tightness, tenderness to palpation along R posterolateral gluteal mm and TFL. Patient will benefit from continued skilled therapeutic intervention to address the above deficits as needed for improved function and QoL.       OBJECTIVE IMPAIRMENTS: difficulty walking, decreased ROM, decreased strength, hypomobility, impaired flexibility, and pain.    ACTIVITY LIMITATIONS: carrying, lifting, bending, sitting, squatting, and transfers   PARTICIPATION LIMITATIONS: cleaning, driving, community activity, occupation, and caring for son with special needs  PERSONAL FACTORS: Past/current experiences and 3+ comorbidities: HTN, leukemia in remission and response to cancer treatment, Hx of stroke)  are also affecting patient's functional outcome.    REHAB POTENTIAL: Good   CLINICAL DECISION MAKING: Evolving/moderate complexity   EVALUATION COMPLEXITY: Moderate     GOALS: Goals reviewed with patient? Yes   SHORT TERM GOALS: Target date: 11/03/2022   Pt will be independent with HEP in order to improve strength and decrease back pain to improve pain-free function at home and work. Baseline: 10/12/22: Baseline HEP initiated Goal status: INITIAL     LONG TERM GOALS: Target date: 11/26/2022   Pt will increase FOTO to at least 70 to demonstrate significant improvement in function at home and work related to back pain  Baseline: 10/12/22: 59 Goal status: INITIAL   2.  Pt will decrease worst hip pain by at least 3 points on the NPRS in order to demonstrate clinically significant reduction in back pain. Baseline: 10/12/22: 9/10  pain at worst Goal status: INITIAL   3.  Pt will tolerate sitting > 1 hour or more without reproduction of L hip pain as needed for spending leisure time with family and longer drives     Baseline: 10/12/22: Pain and limitation with sitting > 30 min Goal status: INITIAL   4.  Patient will have no pain with loaded hip hinge mimicking demands of leaning over his son's bed to complete caregiving activities and aid with treatments  Baseline: 10/12/22: significant pain with trunk flexion and leaning over his son's bed to care for him Goal status: INITIAL   5.  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  Goal status: INITIAL     PLAN: PT FREQUENCY: 1-2x/week   PT DURATION: 6 weeks   PLANNED INTERVENTIONS: Therapeutic exercises, Therapeutic activity, Neuromuscular re-education, Balance training, Patient/Family education, Self Care, Joint mobilization, Joint manipulation, Dry Needling, Electrical stimulation, Spinal manipulation, Spinal mobilization, Cryotherapy, Moist heat, Taping, Traction, Ultrasound, Manual therapy, and Re-evaluation.   PLAN FOR NEXT SESSION: Manual therapy with use of Mulligan techniques for hip mobility, STM/DTM for sensitized hip flexor/groin region. Progress hip mobility as tolerated in pain-free ranges and progress with isometrics followed by isotonics as tolerated by pt.      Valentina Gu, PT, DPT #[id number]  Eilleen Kempf, PT 11/24/2022, 12:40 PM

## 2022-11-24 ENCOUNTER — Ambulatory Visit: Payer: Medicaid Other | Admitting: Physical Therapy

## 2022-11-24 ENCOUNTER — Encounter: Payer: Self-pay | Admitting: Physical Therapy

## 2022-11-24 ENCOUNTER — Encounter: Payer: Medicaid Other | Admitting: Physical Therapy

## 2022-11-24 DIAGNOSIS — R262 Difficulty in walking, not elsewhere classified: Secondary | ICD-10-CM

## 2022-11-24 DIAGNOSIS — M25652 Stiffness of left hip, not elsewhere classified: Secondary | ICD-10-CM

## 2022-11-24 DIAGNOSIS — M25552 Pain in left hip: Secondary | ICD-10-CM

## 2022-11-24 DIAGNOSIS — M6281 Muscle weakness (generalized): Secondary | ICD-10-CM

## 2022-11-24 NOTE — Therapy (Addendum)
OUTPATIENT PHYSICAL THERAPY TREATMENT NOTE   Patient Name: Gabriel English MRN: [id number] DOB:[birth year], 42 y.o., male Today's Date: 11/24/22   END OF SESSION:   PT End of Session - 11/24/22 1654     Visit Number 9    Number of Visits 13    Date for PT Re-Evaluation 11/24/22    Authorization Type [employer] 2023    Authorization Time Period VL based on auth    Progress Note Due on Visit 10    PT Start Time 1647    PT Stop Time 1728    PT Time Calculation (min) 41 min    Activity Tolerance Patient tolerated treatment well    Behavior During Therapy WFL for tasks assessed/performed              Past Medical History:  Diagnosis Date   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 No pertinent surgical history. There are no problems to display for this patient.  PCP: Cyndi Bender, PA-C   REFERRING PROVIDER: Elon Alas, PA-C   REFERRING DIAG: [phone] (ICD-10-CM) - Left hip pain   THERAPY DIAG:  Pain in left hip  Difficulty in walking, not elsewhere classified  Stiffness of left hip, not elsewhere classified  Muscle weakness (generalized)  Rationale for Evaluation and Treatment Rehabilitation  PERTINENT HISTORY: Patient is a 42 year old male with primary complaint of L hip pain with insidious atraumatic onset. Patient reports deep pain along lateral thigh/C region along hip and pain along L groin as well. Pt has Hx of leukemia; pt is in remission for leukemia. Pt had stroke last year and was able to recover well with use of TPA. Patient reports some intermittent LLE paresthesias. Pt has son with special needs and has to lean over his bed when helping with treatment; pt reports notable  pain with sustained forward bending/trunk flexion and with sitting - he often has to shift his weight in sitting to opposite hip.   PAIN:    Pain Intensity: Present: 5-6/10, Best: 3/10, Worst: 9/10 Pain location: R lateral hip/C-shape, R groin Pain Quality: intermittent, stabbing, "pulling" something, squeezing or "something in a bind" Radiating: No  Numbness/Tingling: Yes, tingling intermittently down to toes Focal Weakness: Yes, intermittent sensation of buckling Aggravating factors: Pain with trunk flexion/leaning over, sitting with weight on L side, pivoting over LLE, prolonged sitting/driving Relieving factors: offloading L hip, shifting weight to R side;  24-hour pain behavior: worse in AM How long can you sit: 30 minutes History of prior back injury, pain, surgery, or therapy: No    Imaging: Yes  LEFT HIP, 3 VIEWS WITH VISUALIZATION OF THE PELVIS History: M25.552 Pain in left hip Findings and impression: 1.No acute abnormality 2.Specifically, no significant degenerative change 3. 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 Pt reports tolerating last visit well. He reports no new complaints this evening.    PAIN:  Are you having pain? No pain at arrival Pain location: L hip, medial hip flexor/groin at arrival today    OBJECTIVE: (objective measures completed at initial evaluation unless otherwise dated)   Patient Surveys  FOTO 59, predicted outcome score of 69   Cognition Patient is oriented to person, place, and time.  Recent memory is intact.  Remote memory is intact.  Attention span and concentration are intact.  Expressive speech is intact.  Patient's fund of knowledge is within normal limits for educational level.                          Gross Musculoskeletal Assessment Tremor: None Bulk: Normal Tone: Normal No visible step-off along spinal column, no signs of scoliosis   GAIT: Distance walked: 50 ft Assistive device utilized: None Level of assistance: Complete Independence Comments: Mild decreased weight shift to LLE   Squat: pt does not demonstrate asymmetric weight shift, but he limits ROM 0-60 deg and demonstrates limited combined hip/knee flexion. Mild loss of neutral thoracolumbar spine with increased kyphosis at bottom of squat.     Posture: Lumbar lordosis: WNL Iliac crest height: Equal bilaterally Lumbar lateral shift: Negative   AROM     AROM (Normal range in degrees) AROM   Lumbar    Flexion (65) 50%*  Extension (30) 100%  Right lateral flexion (25) 100%  Left lateral flexion (25) 100%  Right rotation (30) 100%*  Left rotation (30) 100%*         Hip Right Left  Flexion (125)   90  Extension (15)   10  Abduction (40)   45  Adduction       Internal Rotation (45)   30  External Rotation (45)   45         (* = pain; Blank rows = not tested)   LE MMT: MMT (out of 5) Right   Left    Hip flexion  5 4+  Hip extension  5  4*  Hip abduction  5  4+*  Hip  adduction      Hip internal rotation 5 4-*  Hip external rotation 5 4*  Knee flexion 5 4*  Knee extension 5 4+  Ankle dorsiflexion      Ankle plantarflexion      Ankle inversion      Ankle eversion      (* = pain; Blank rows = not tested)   Sensation Grossly intact to light touch throughout bilateral LEs as determined by testing dermatomes L2-S2. Proprioception, stereognosis, and hot/cold testing deferred on this date.   Reflexes Deferred   Muscle Length Hamstrings: R: Negative L: Negative [place] (quadriceps): R: Positive L: Positive Thomas (hip flexors): R: Not examined L: Positive Ober: R: Not examined L: Not examined   Palpation Location Right Left         Lumbar paraspinals   0  Quadratus Lumborum   0  Gluteus Maximus   1  Gluteus Medius   1  Deep hip external rotators   1  PSIS   0  Fortin's Area (SIJ)   0  Greater Trochanter   0  TFL   1  (Blank rows = not tested) Graded on 0-4 scale (0 = no pain, 1 = pain, 2 = pain with wincing/grimacing/flinching, 3 = pain with withdrawal, 4 = unwilling to allow palpation)   Passive Accessory Intervertebral Motion Pt denies reproduction of back pain or concordant hip pain with CPA L1-L5 and UPA bilaterally L1-L5.    Special Tests Lumbar Radiculopathy and Discogenic: Centralization and Peripheralization (SN 92, -LR 0.12): Not done Slump (SN 83, -LR 0.32): R: Negative L: Negative SLR (SN 92, -LR 0.29): R: Negative L:  Negative     Hip: FABER (SN 81): R: Negative L: Positive FADIR (SN 94): R: Not examined L: Positive Hip scour (SN 50): R: Negative L: Positive        TODAY'S TREATMENT: DATE: 11/24/2022    Manual Therapy - for symptom modulation, soft tissue sensitivity and mobility, joint mobility, ROM   In supine: L hip PROM to check for motion loss and reproduction of symptoms;x 1 minute  -limited tolerance of L hip flexion and IR today STM along R pectineus with hip flexors placed in shortened position with pillows  under knees; x 3 minutes    *not today* TPR x 2 rounds with direct pressure onto R pectineus (2-3 mins for each rounds) LLE lateral distraction with Mulligan mobilization belt with pt in hooklying position; intermittent holds x 10 sec; performed x 3 minutes MWM, hip distraction with conjunct hip flexion and IR; x10 each  Trial of long-leg distraction for analgesic effect, mild "pull" is reported without notable relief or pain modulation; x 1 minute with 10-second intermittent pulls MWM with conjunct IR; performed x 10 repetitions  -difficulty with tolerating hip flexion > 90 deg on L IASTM with Theraband roller along hip flexors/proximal quadriceps x 10 minutes Trigger Point Dry Needling (TDN), unbilled Education performed with patient regarding potential benefit and mild adverse effects of TDN at previous session. Pt provided verbal consent to treatment. TDN performed to  L adductor magnus x 2, 0.30 x 60 single needle placements with local twitch response (LTR). Pistoning technique utilized.       Therapeutic Exercise - for improved soft tissue flexibility and extensibility as needed for ROM, hip complex mobility as needed for movement require to perform transfers and functional activities  Quadruped rock back 1x10  Ambulance person in quadruped; 2x10  [place] in quadruped; 2x10  Glute bridge with [employer] for hip abduction moment; 2x10   -full range without notable pain today  Sidestep with Blue Tband superior to patellae; 5x D/B on blue agility ladder   [employer] double-limb squat with depth to tolerance, Level 22; 2x10  Unipedal stance on Airex; 2x30 sec with stance on LLE  -increase volume next visit    PATIENT EDUCATION: Discussed expected progression of PT given minimal pain at this time.    *on hold* Sidelying clamshell; 2x10 (modified ROM, 50% range)  *not today* Hip flexor alternating isometric; reviewed for HEP Butterfly stretch; 2x30 sec Bent-knee fallout  for hip ER/ABD ROM; 1x10 in hooklying position; Active hip IR in sitting, foam roller between knees; 1x10  -mild groin pain at end-range IR, stopped after first set Half kneel hip flexor stretch with contralateral sidebend for TFL emphasis; 3x30 sec hold Seated hip abduction and adduction isometrics; 1x10, 5 sec hold in either direction Cold pack (unbilled) - for anti-inflammatory and analgesic effect as needed for reduced pain and improved ability to participate in active PT intervention, along L hip flexor in supine with 2 pillows under knee for pt to rest in loose-packed position for L hip, x 5 minutes       PATIENT EDUCATION:  Education details: see above for patient education details Person educated: Patient Education method: Explanation, Demonstration, and Handouts Education comprehension: verbalized understanding and returned demonstration     HOME EXERCISE PROGRAM:  Access Code: [email or web address] URL: [email or web address] Date: 11/17/2022 Prepared by: Valentina Gu  Exercises - Modified [place] [employer]  - 2 x daily - 7 x weekly - 3 sets - 30sec hold - Supine Piriformis Stretch with Foot on Ground  - 2 x daily - 7 x weekly - 3 sets - 30sec hold - Half Kneeling Hip Flexor Stretch with Sidebend  - 2 x daily - 7 x weekly - 3 sets - 30sec hold - Supine Butterfly Groin Stretch  - 2 x daily - 7 x weekly - 3 sets - 30sec hold - Seated Hip Adduction Squeeze with Ball  - 2 x daily - 7 x weekly - 2 sets - 10 reps - 5sec hold - Hooklying Isometric Hip Abduction with Belt  - 2 x daily - 7 x weekly - 2 sets - 10 reps - 5sec hold      ASSESSMENT:   CLINICAL IMPRESSION:  Patient is making good progress at this time in spite of difficulty tolerating notable activity early in plan of care. Pt fortunately is able to tolerate full hip ROM and complete squatting and single-ilmb weightbearing without significant reproduction of symptoms. Pt is continuing with stretching and isometrics  per established HEP. We will continue with hip strengthening in pain-free range and continue with activity modifications for work as needed. Patient has remaining deficits in decreased gluteal and quad/hamstrings strength, decreased L hip flexion/extension/IR ROM, quadriceps and hip flexor tightness, tenderness to palpation along R posterolateral gluteal mm and TFL. Patient will benefit from continued skilled therapeutic intervention to address the above deficits as needed for improved function and QoL.       OBJECTIVE IMPAIRMENTS: difficulty walking, decreased ROM, decreased strength, hypomobility, impaired flexibility, and pain.    ACTIVITY LIMITATIONS: carrying, lifting, bending, sitting, squatting, and transfers   PARTICIPATION LIMITATIONS: cleaning, driving, community activity, occupation, and caring for son with special needs   PERSONAL FACTORS: Past/current experiences and 3+  comorbidities: HTN, leukemia in remission and response to cancer treatment, Hx of stroke)  are also affecting patient's functional outcome.    REHAB POTENTIAL: Good   CLINICAL DECISION MAKING: Evolving/moderate complexity   EVALUATION COMPLEXITY: Moderate     GOALS: Goals reviewed with patient? Yes   SHORT TERM GOALS: Target date: 11/03/2022   Pt will be independent with HEP in order to improve strength and decrease back pain to improve pain-free function at home and work. Baseline: 10/12/22: Baseline HEP initiated Goal status: INITIAL     LONG TERM GOALS: Target date: 11/26/2022   Pt will increase FOTO to at least 70 to demonstrate significant improvement in function at home and work related to back pain  Baseline: 10/12/22: 59 Goal status: INITIAL   2.  Pt will decrease worst hip pain by at least 3 points on the NPRS in order to demonstrate clinically significant reduction in back pain. Baseline: 10/12/22: 9/10 pain at worst Goal status: INITIAL   3.  Pt will tolerate sitting > 1 hour or more without  reproduction of L hip pain as needed for spending leisure time with family and longer drives     Baseline: 10/12/22: Pain and limitation with sitting > 30 min Goal status: INITIAL   4.  Patient will have no pain with loaded hip hinge mimicking demands of leaning over his son's bed to complete caregiving activities and aid with treatments  Baseline: 10/12/22: significant pain with trunk flexion and leaning over his son's bed to care for him Goal status: INITIAL   5.  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  Goal status: INITIAL     PLAN: PT FREQUENCY: 1-2x/week   PT DURATION: 6 weeks   PLANNED INTERVENTIONS: Therapeutic exercises, Therapeutic activity, Neuromuscular re-education, Balance training, Patient/Family education, Self Care, Joint mobilization, Joint manipulation, Dry Needling, Electrical stimulation, Spinal manipulation, Spinal mobilization, Cryotherapy, Moist heat, Taping, Traction, Ultrasound, Manual therapy, and Re-evaluation.   PLAN FOR NEXT SESSION: Manual therapy with use of Mulligan techniques for hip mobility, STM/DTM for sensitized hip flexor/groin region. Progress hip mobility as tolerated in pain-free ranges and progress with isometrics followed by isotonics as tolerated by pt.     Valentina Gu, PT, DPT #[id number]  Eilleen Kempf, PT 11/24/2022, 5:11 PM

## 2022-11-29 ENCOUNTER — Ambulatory Visit: Payer: Medicaid Other | Admitting: Physical Therapy

## 2022-11-29 ENCOUNTER — Encounter: Payer: Medicaid Other | Admitting: Physical Therapy

## 2022-11-29 DIAGNOSIS — M25552 Pain in left hip: Secondary | ICD-10-CM

## 2022-11-29 DIAGNOSIS — M25652 Stiffness of left hip, not elsewhere classified: Secondary | ICD-10-CM

## 2022-11-29 DIAGNOSIS — R262 Difficulty in walking, not elsewhere classified: Secondary | ICD-10-CM

## 2022-11-29 DIAGNOSIS — M6281 Muscle weakness (generalized): Secondary | ICD-10-CM

## 2022-11-29 NOTE — Therapy (Signed)
OUTPATIENT PHYSICAL THERAPY TREATMENT AND PROGRESS NOTE   Dates of reporting period  10/12/22   to   11/29/22    Patient Name: Kord Monette MRN: [id number] DOB:February 03, 1981, 42 y.o., male Today's Date: 11/29/22   END OF SESSION:   PT End of Session - 11/30/22 1223     Visit Number 10    Number of Visits 13    Date for PT Re-Evaluation 12/22/22    Authorization Type [employer] 2023    Authorization Time Period VL based on auth    Progress Note Due on Visit 10    PT Start Time 1648    PT Stop Time 1732    PT Time Calculation (min) 44 min    Activity Tolerance Patient tolerated treatment well    Behavior During Therapy WFL for tasks assessed/performed              Past Medical History:  Diagnosis Date   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 No pertinent surgical history. There are no problems to display for this patient.  PCP: Cyndi Bender, PA-C   REFERRING PROVIDER: Elon Alas, PA-C   REFERRING DIAG: [phone] (ICD-10-CM) - Left hip pain   THERAPY DIAG:  Pain in left hip  Difficulty in walking, not elsewhere classified  Stiffness of left hip, not elsewhere classified  Muscle weakness (generalized)  Rationale for Evaluation and Treatment Rehabilitation  PERTINENT HISTORY: Patient is a 42 year old male with primary complaint of L hip pain with insidious atraumatic onset. Patient reports deep pain along lateral thigh/C region along hip and pain along L groin as well. Pt has Hx of leukemia; pt is in remission for leukemia. Pt had stroke last year and was able to recover well with use of TPA. Patient reports some intermittent LLE paresthesias. Pt has son with special needs and  has to lean over his bed when helping with treatment; pt reports notable pain with sustained forward bending/trunk flexion and with sitting - he often has to shift his weight in sitting to opposite hip.   PAIN:    Pain Intensity: Present: 5-6/10, Best: 3/10, Worst: 9/10 Pain location: R lateral hip/C-shape, R groin Pain Quality: intermittent, stabbing, "pulling" something, squeezing or "something in a bind" Radiating: No  Numbness/Tingling: Yes, tingling intermittently down to toes Focal Weakness: Yes, intermittent sensation of buckling Aggravating factors: Pain with trunk flexion/leaning over, sitting with weight on L side, pivoting over LLE, prolonged sitting/driving Relieving factors: offloading L hip, shifting weight to R side;  24-hour pain behavior: worse in AM How long can you sit: 30 minutes History of prior back injury, pain, surgery, or therapy: No    Imaging: Yes  LEFT HIP, 3 VIEWS WITH VISUALIZATION OF THE PELVIS History: M25.552 Pain in left hip Findings and impression: 1.No acute abnormality 2.Specifically, no significant degenerative change 3. 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 He reports 90% SANE score. He feels that he has tolerated work and childcare duties well. Pt denies significant functional limitations at this time.    PAIN:  Are you having pain? No pain at arrival Pain location: L hip, medial hip flexor/groin at arrival today    OBJECTIVE: (objective measures completed at initial evaluation unless otherwise dated)   Patient Surveys  FOTO 59, predicted outcome score of 22   Cognition Patient is oriented to person, place, and time.  Recent memory is intact.  Remote memory is intact.  Attention span and concentration are intact.  Expressive speech is intact.  Patient's fund of knowledge is within normal limits for educational level.                          Gross Musculoskeletal Assessment Tremor: None Bulk: Normal Tone: Normal No visible step-off along spinal column, no signs of scoliosis   GAIT: Distance walked: 50 ft Assistive device utilized: None Level of assistance: Complete Independence Comments: Mild decreased weight shift to LLE   Squat: pt does not demonstrate asymmetric weight shift, but he limits ROM 0-60 deg and demonstrates limited combined hip/knee flexion. Mild loss of neutral thoracolumbar spine with increased kyphosis at bottom of squat.     Posture: Lumbar lordosis: WNL Iliac crest height: Equal bilaterally Lumbar lateral shift: Negative   AROM     AROM (Normal range in degrees) AROM   Lumbar    Flexion (65) 50%*  Extension (30) 100%  Right lateral flexion (25) 100%  Left lateral flexion (25) 100%  Right rotation (30) 100%*  Left rotation (30) 100%*         Hip Right Left  Flexion (125)   90  Extension (15)   10  Abduction (40)   45  Adduction       Internal Rotation (45)   30  External Rotation (45)   45         (* =  pain; Blank rows = not tested)   LE MMT: MMT (out of 5) Right   Left    Hip flexion 5 4+  Hip extension  5  4*  Hip abduction  5  4+*  Hip adduction      Hip internal rotation 5 4-*  Hip external rotation 5 4*  Knee flexion 5 4*  Knee extension 5 4+  Ankle dorsiflexion      Ankle plantarflexion      Ankle inversion      Ankle eversion      (* = pain; Blank rows = not tested)   Sensation Grossly intact to light touch throughout bilateral LEs as determined by testing dermatomes L2-S2. Proprioception, stereognosis, and hot/cold testing deferred on this date.   Reflexes Deferred   Muscle Length Hamstrings: R: Negative L: Negative Ely (quadriceps): R: Positive L: Positive Thomas (hip flexors): R: Not examined L: Positive Ober: R: Not examined L: Not examined   Palpation Location Right Left         Lumbar paraspinals   0  Quadratus Lumborum   0  Gluteus Maximus   1  Gluteus Medius   1  Deep hip external rotators   1  PSIS   0  Fortin's Area (SIJ)   0  Greater Trochanter   0  TFL   1  (Blank rows = not tested) Graded on 0-4 scale (0 = no pain, 1 = pain, 2 = pain with wincing/grimacing/flinching, 3 = pain with withdrawal, 4 = unwilling to allow palpation)   Passive Accessory Intervertebral Motion Pt denies reproduction of back pain or concordant hip pain with CPA L1-L5 and UPA bilaterally L1-L5.    Special Tests Lumbar Radiculopathy and Discogenic: Centralization and Peripheralization (SN 92, -LR 0.12): Not done Slump (SN 83, -LR 0.32): R: Negative L: Negative SLR (SN 92, -LR 0.29): R: Negative L:  Negative     Hip: FABER (SN 81): R: Negative L: Positive FADIR (SN 94): R: Not examined L: Positive Hip scour (SN 50): R: Negative L: Positive        TODAY'S TREATMENT: DATE: 11/29/2022    Manual Therapy - for symptom modulation, soft tissue sensitivity and mobility, joint mobility, ROM   In supine: L hip PROM to check for motion loss and reproduction of  symptoms;x 1 minute  -limited tolerance of L hip flexion and IR today STM along R pectineus with hip flexors placed in shortened position with pillows under knees; x 3 minutes    *not today* TPR x 2 rounds with direct pressure onto R pectineus (2-3 mins for each rounds) LLE lateral distraction with Mulligan mobilization belt with pt in hooklying position; intermittent holds x 10 sec; performed x 3 minutes MWM, hip distraction with conjunct hip flexion and IR; x10 each  Trial of long-leg distraction for analgesic effect, mild "pull" is reported without notable relief or pain modulation; x 1 minute with 10-second intermittent pulls MWM with conjunct IR; performed x 10 repetitions  -difficulty with tolerating hip flexion > 90 deg on L IASTM with Theraband roller along hip flexors/proximal quadriceps x 10 minutes Trigger Point Dry Needling (TDN), unbilled Education performed with patient regarding potential benefit and mild adverse effects of TDN at previous session. Pt provided verbal consent to treatment. TDN performed to  L adductor magnus x 2, 0.30 x 60 single needle placements with local twitch response (LTR). Pistoning technique utilized.       Therapeutic Exercise - for improved soft tissue  flexibility and extensibility as needed for ROM, hip complex mobility as needed for movement require to perform transfers and functional activities, hip complex stability as needed for improved ability to perform functional CKC activities   *GOAL UPDATE PERFORMED  Glute bridge with Black Tband for hip abduction moment; 2x10   -full range without notable pain today  Sidestep with Blue Tband superior to patellae; 5x D/B on blue agility ladder   Unipedal stance on Airex; 3x30 sec with stance on LLE  In // bars: BOSU squats, depth to tolerance; slow eccentric; 2x10  Modified deadlift with dumbbells bilateral UE, 8-lb Dbells; 2x10  -heavy cueing for hip hinge technique with maintenance of neutral  spine    PATIENT EDUCATION: Discussed current progress with PT, goals met, anticipated discharge with brief trial of independent home exercise program    *on hold* Sidelying clamshell; 2x10 (modified ROM, 50% range)  *not today* [employer] double-limb squat with depth to tolerance, Level 22; 2x10 Donkey kicks in quadruped; 2x10 Quadruped rock back 1x10 Fire hydrant in quadruped; 2x10 Hip flexor alternating isometric; reviewed for HEP Butterfly stretch; 2x30 sec Bent-knee fallout for hip ER/ABD ROM; 1x10 in hooklying position; Active hip IR in sitting, foam roller between knees; 1x10  -mild groin pain at end-range IR, stopped after first set Half kneel hip flexor stretch with contralateral sidebend for TFL emphasis; 3x30 sec hold Seated hip abduction and adduction isometrics; 1x10, 5 sec hold in either direction Cold pack (unbilled) - for anti-inflammatory and analgesic effect as needed for reduced pain and improved ability to participate in active PT intervention, along L hip flexor in supine with 2 pillows under knee for pt to rest in loose-packed position for L hip, x 5 minutes       PATIENT EDUCATION:  Education details: see above for patient education details Person educated: Patient Education method: Explanation, Demonstration, and Handouts Education comprehension: verbalized understanding and returned demonstration     HOME EXERCISE PROGRAM:  Access Code: [email or web address] URL: [email or web address] Date: 11/17/2022 Prepared by: Valentina Gu  Exercises - Modified [place] [employer]  - 2 x daily - 7 x weekly - 3 sets - 30sec hold - Supine Piriformis Stretch with Foot on Ground  - 2 x daily - 7 x weekly - 3 sets - 30sec hold - Half Kneeling Hip Flexor Stretch with Sidebend  - 2 x daily - 7 x weekly - 3 sets - 30sec hold - Supine Butterfly Groin Stretch  - 2 x daily - 7 x weekly - 3 sets - 30sec hold - Seated Hip Adduction Squeeze with Ball  - 2 x daily - 7 x weekly  - 2 sets - 10 reps - 5sec hold - Hooklying Isometric Hip Abduction with Belt  - 2 x daily - 7 x weekly - 2 sets - 10 reps - 5sec hold      ASSESSMENT:   CLINICAL IMPRESSION:  Patient has met majority of established goals and has notably improved tolerance of transferring, squatting, kneeling, and ambulation/weightbearing. Pt tolerates community-level mobility and has been able to complete home improvement projects at home involving kneeling and crawling without significant c/o pain. Patient reports no significant symptoms over last 1.5 weeks. He has met his FOTO goal and is making excellent progress in spite of difficulty with exercise tolerance and substantial pain early in plan of care. Patient has remaining deficits in decreased gluteal strength, quadriceps and hip flexor tightness, and positional intolerance for prolonged hip flexion/prolonged sitting while out in public. Patient  will benefit from continued skilled therapeutic intervention to address the above deficits as needed for improved function and QoL.       OBJECTIVE IMPAIRMENTS: difficulty walking, decreased ROM, decreased strength, hypomobility, impaired flexibility, and pain.    ACTIVITY LIMITATIONS: carrying, lifting, bending, sitting, squatting, and transfers   PARTICIPATION LIMITATIONS: cleaning, driving, community activity, occupation, and caring for son with special needs   PERSONAL FACTORS: Past/current experiences and 3+ comorbidities: HTN, leukemia in remission and response to cancer treatment, Hx of stroke)  are also affecting patient's functional outcome.    REHAB POTENTIAL: Good   CLINICAL DECISION MAKING: Evolving/moderate complexity   EVALUATION COMPLEXITY: Moderate     GOALS: Goals reviewed with patient? Yes   SHORT TERM GOALS: Target date: 11/03/2022   Pt will be independent with HEP in order to improve strength and decrease back pain to improve pain-free function at home and work. Baseline: 10/12/22:  Baseline HEP initiated.   11/29/22: Pt is compliant with HEP.  Goal status: ACHIEVED     LONG TERM GOALS: Target date: 11/26/2022   Pt will increase FOTO to at least 70 to demonstrate significant improvement in function at home and work related to back pain  Baseline: 10/12/22: 59.    11/29/22:  85/70 Goal status: ACHIEVED   2.  Pt will decrease worst hip pain by at least 3 points on the NPRS in order to demonstrate clinically significant reduction in back pain. Baseline: 10/12/22: 9/10 pain at worst.  11/29/22: no notable pain over last week Goal status: ACHIEVED   3.  Pt will tolerate sitting > 1 hour or more without reproduction of L hip pain as needed for spending leisure time with family and longer drives     Baseline: 10/12/22: Pain and limitation with sitting > 30 min.   11/29/22: Pain with sitting in restaurant chairs - pt has no issue with car seat or seats in his home Goal status: IN PROGRESS   4.  Patient will have no pain with loaded hip hinge mimicking demands of leaning over his son's bed to complete caregiving activities and aid with treatments  Baseline: 10/12/22: significant pain with trunk flexion and leaning over his son's bed to care for him.   11/29/22: Pt is able to perform this today with no pain  Goal status: ACHIEVED   5.  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   11/29/22: Performed today with no significant deviations or asymmetries  Goal status: ACHIEVED      PLAN: PT FREQUENCY: 1-2x/week   PT DURATION: 6 weeks   PLANNED INTERVENTIONS: Therapeutic exercises, Therapeutic activity, Neuromuscular re-education, Balance training, Patient/Family education, Self Care, Joint mobilization,  Joint manipulation, Dry Needling, Electrical stimulation, Spinal manipulation, Spinal mobilization, Cryotherapy, Moist heat, Taping, Traction, Ultrasound, Manual therapy, and Re-evaluation.   PLAN FOR NEXT SESSION: Manual therapy with use of Mulligan techniques for hip mobility, STM/DTM for sensitized hip flexor/groin region. Progress hip mobility as tolerated in pain-free ranges and progress with isometrics followed by isotonics as tolerated by pt.  Will complete trial of independent HEP x 2 weeks following this week with anticipated discharge from PT.   Valentina Gu, PT, DPT #[id number]  Eilleen Kempf, PT 11/30/2022, 12:24 PM

## 2022-11-30 ENCOUNTER — Encounter: Payer: Self-pay | Admitting: Physical Therapy

## 2022-12-01 ENCOUNTER — Ambulatory Visit: Payer: Medicaid Other | Attending: Surgery | Admitting: Physical Therapy

## 2022-12-01 ENCOUNTER — Encounter: Payer: Medicaid Other | Admitting: Physical Therapy

## 2022-12-01 DIAGNOSIS — R262 Difficulty in walking, not elsewhere classified: Secondary | ICD-10-CM

## 2022-12-01 DIAGNOSIS — M6281 Muscle weakness (generalized): Secondary | ICD-10-CM

## 2022-12-01 DIAGNOSIS — M25552 Pain in left hip: Secondary | ICD-10-CM | POA: Insufficient documentation

## 2022-12-01 DIAGNOSIS — M25652 Stiffness of left hip, not elsewhere classified: Secondary | ICD-10-CM

## 2022-12-01 NOTE — Therapy (Signed)
OUTPATIENT PHYSICAL THERAPY TREATMENT   Patient Name: Gabriel English MRN: [id number] DOB:December 30, 1980, 42 y.o., male Today's Date: 12/01/22   END OF SESSION:   PT End of Session - 12/05/22 1138     Visit Number 11    Number of Visits 13    Date for PT Re-Evaluation 12/22/22    Authorization Type [employer] 2023    Authorization Time Period VL based on auth    Progress Note Due on Visit 10    PT Start Time 1646    PT Stop Time 1726    PT Time Calculation (min) 40 min    Activity Tolerance Patient tolerated treatment well    Behavior During Therapy WFL for tasks assessed/performed               Past Medical History:  Diagnosis Date   Acute promyelocytic leukemia in remission ([place])    Essential hypertension    History of arterial ischemic stroke    Left-sided weakness    Migraine without aura and without status migrainosus, not intractable    Prediabetes    Psoriasis    Severe obstructive sleep apnea    Tachycardia    History reviewed. No pertinent surgical history. There are no problems to display for this patient.  PCP: Cyndi Bender, PA-C   REFERRING PROVIDER: Elon Alas, PA-C   REFERRING DIAG: [id number] (ICD-10-CM) - Left hip pain   THERAPY DIAG:  Pain in left hip  Difficulty in walking, not elsewhere classified  Stiffness of left hip, not elsewhere classified  Muscle weakness (generalized)  Rationale for Evaluation and Treatment Rehabilitation  PERTINENT HISTORY: Patient is a 42 year old male with primary complaint of L hip pain with insidious atraumatic onset. Patient reports deep pain along lateral thigh/C region along hip and pain along L groin as well. Pt has Hx of leukemia; pt is in remission for leukemia. Pt had stroke last year and was able to recover well with use of TPA. Patient reports some intermittent LLE paresthesias. Pt has son with special needs and has to lean over his bed when helping with treatment; pt reports notable pain  with sustained forward bending/trunk flexion and with sitting - he often has to shift his weight in sitting to opposite hip.   PAIN:    Pain Intensity: Present: 5-6/10, Best: 3/10, Worst: 9/10 Pain location: R lateral hip/C-shape, R groin Pain Quality: intermittent, stabbing, "pulling" something, squeezing or "something in a bind" Radiating: No  Numbness/Tingling: Yes, tingling intermittently down to toes Focal Weakness: Yes, intermittent sensation of buckling Aggravating factors: Pain with trunk flexion/leaning over, sitting with weight on L side, pivoting over LLE, prolonged sitting/driving Relieving factors: offloading L hip, shifting weight to R side;  24-hour pain behavior: worse in AM How long can you sit: 30 minutes History of prior back injury, pain, surgery, or therapy: No    Imaging: Yes  LEFT HIP, 3 VIEWS WITH VISUALIZATION OF THE PELVIS History: M25.552 Pain in left hip Findings and impression: 1.No acute abnormality 2.Specifically, no significant degenerative change 3. 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 No recent issues with bending or childcare duties with his son.    PAIN:  Are you having pain? No pain at arrival Pain location: L hip, medial hip flexor/groin at arrival today    OBJECTIVE: (objective measures completed at initial evaluation unless otherwise dated)   Patient Surveys  FOTO 59, predicted outcome score of 50   Cognition Patient is oriented to person, place, and time.  Recent memory is intact.  Remote memory is intact.  Attention span and concentration are intact.  Expressive speech is intact.  Patient's fund of knowledge is within normal limits for educational level.                          Gross Musculoskeletal Assessment Tremor: None Bulk: Normal Tone: Normal No visible step-off along spinal column, no signs of scoliosis   GAIT: Distance walked: 50 ft Assistive device utilized: None Level of assistance: Complete Independence Comments: Mild decreased weight shift to LLE   Squat: pt does not demonstrate asymmetric weight shift, but he limits ROM 0-60 deg and demonstrates limited combined hip/knee flexion. Mild loss of neutral thoracolumbar spine with increased kyphosis at bottom of squat.     Posture: Lumbar lordosis: WNL Iliac crest height: Equal bilaterally Lumbar lateral shift: Negative   AROM     AROM (Normal range in degrees) AROM   Lumbar    Flexion (65) 50%*  Extension (30) 100%  Right lateral flexion (25) 100%  Left lateral flexion (25) 100%  Right rotation (30) 100%*  Left rotation (30) 100%*         Hip Right Left  Flexion (125)   90  Extension (15)   10  Abduction (40)   45  Adduction       Internal Rotation (45)   30  External Rotation (45)   45         (* = pain; Blank rows = not tested)   LE MMT: MMT (out of 5) Right   Left    Hip flexion 5 4+  Hip  extension  5  4*  Hip abduction  5  4+*  Hip adduction      Hip internal  rotation 5 4-*  Hip external rotation 5 4*  Knee flexion 5 4*  Knee extension 5 4+  Ankle dorsiflexion      Ankle plantarflexion      Ankle inversion      Ankle eversion      (* = pain; Blank rows = not tested)   Sensation Grossly intact to light touch throughout bilateral LEs as determined by testing dermatomes L2-S2. Proprioception, stereognosis, and hot/cold testing deferred on this date.   Reflexes Deferred   Muscle Length Hamstrings: R: Negative L: Negative Ely (quadriceps): R: Positive L: Positive Thomas (hip flexors): R: Not examined L: Positive Ober: R: Not examined L: Not examined   Palpation Location Right Left         Lumbar paraspinals   0  Quadratus Lumborum   0  Gluteus Maximus   1  Gluteus Medius   1  Deep hip external rotators   1  PSIS   0  Fortin's Area (SIJ)   0  Greater Trochanter   0  TFL   1  (Blank rows = not tested) Graded on 0-4 scale (0 = no pain, 1 = pain, 2 = pain with wincing/grimacing/flinching, 3 = pain with withdrawal, 4 = unwilling to allow palpation)   Passive Accessory Intervertebral Motion Pt denies reproduction of back pain or concordant hip pain with CPA L1-L5 and UPA bilaterally L1-L5.    Special Tests Lumbar Radiculopathy and Discogenic: Centralization and Peripheralization (SN 92, -LR 0.12): Not done Slump (SN 83, -LR 0.32): R: Negative L: Negative SLR (SN 92, -LR 0.29): R: Negative L:  Negative     Hip: FABER (SN 81): R: Negative L: Positive FADIR (SN 94): R: Not examined L: Positive Hip scour (SN 50): R: Negative L: Positive        TODAY'S TREATMENT: DATE: 12/01/22    Manual Therapy - for symptom modulation, soft tissue sensitivity and mobility, joint mobility, ROM   In supine: L hip PROM to check for motion loss and reproduction of symptoms;x 1 minute STM along R pectineus with hip flexors placed in shortened position with pillows under knees; x 1 minute  -no sensitivity to palpation today    *not today* TPR  x 2 rounds with direct pressure onto R pectineus (2-3 mins for each rounds) LLE lateral distraction with Mulligan mobilization belt with pt in hooklying position; intermittent holds x 10 sec; performed x 3 minutes MWM, hip distraction with conjunct hip flexion and IR; x10 each  Trial of long-leg distraction for analgesic effect, mild "pull" is reported without notable relief or pain modulation; x 1 minute with 10-second intermittent pulls MWM with conjunct IR; performed x 10 repetitions  -difficulty with tolerating hip flexion > 90 deg on L IASTM with Theraband roller along hip flexors/proximal quadriceps x 10 minutes Trigger Point Dry Needling (TDN), unbilled Education performed with patient regarding potential benefit and mild adverse effects of TDN at previous session. Pt provided verbal consent to treatment. TDN performed to  L adductor magnus x 2, 0.30 x 60 single needle placements with local twitch response (LTR). Pistoning technique utilized.       Therapeutic Exercise - for improved soft tissue flexibility and extensibility as needed for ROM, hip complex mobility as needed for movement require to perform transfers and functional activities, hip complex stability as needed for improved ability to  perform functional CKC activities   Glute bridge with Black Tband for hip abduction moment; 2x10   -full range without notable pain today  Sidelying hip abduction; 2x10  Sidestep with Blue Tband superior to patellae; 5x D/B on blue agility ladder   Unipedal stance on Airex; 2x30 sec with stance on LLE  In // bars: BOSU squats, depth to tolerance; slow eccentric; 2x10    PATIENT EDUCATION: Discussed current progress. Reviewed current HEP and updated for advanced hip strengthening.    *on hold* Sidelying clamshell; 2x10 (modified ROM, 50% range)  *not today* Modified deadlift with dumbbells bilateral UE, 8-lb Dbells; 2x10  -heavy cueing for hip hinge technique with maintenance of  neutral spine  Total Gym double-limb squat with depth to tolerance, Level 22; 2x10 Donkey kicks in quadruped; 2x10 Quadruped rock back 1x10 Fire hydrant in quadruped; 2x10 Hip flexor alternating isometric; reviewed for HEP Butterfly stretch; 2x30 sec Bent-knee fallout for hip ER/ABD ROM; 1x10 in hooklying position; Active hip IR in sitting, foam roller between knees; 1x10  -mild groin pain at end-range IR, stopped after first set Half kneel hip flexor stretch with contralateral sidebend for TFL emphasis; 3x30 sec hold Seated hip abduction and adduction isometrics; 1x10, 5 sec hold in either direction Cold pack (unbilled) - for anti-inflammatory and analgesic effect as needed for reduced pain and improved ability to participate in active PT intervention, along L hip flexor in supine with 2 pillows under knee for pt to rest in loose-packed position for L hip, x 5 minutes       PATIENT EDUCATION:  Education details: see above for patient education details Person educated: Patient Education method: Explanation, Demonstration, and Handouts Education comprehension: verbalized understanding and returned demonstration     HOME EXERCISE PROGRAM:  Access Code: [email or web address] URL: [email or web address] Date: 12/05/2022 Prepared by: Valentina Gu  Exercises - Modified [place]  - 2 x daily - 7 x weekly - 3 sets - 30sec hold - Supine Piriformis Stretch with Foot on Ground  - 2 x daily - 7 x weekly - 3 sets - 30sec hold - Half Kneeling Hip Flexor Stretch with Sidebend  - 2 x daily - 7 x weekly - 3 sets - 30sec hold - Sidelying Hip Abduction  - 1 x daily - 7 x weekly - 2 sets - 10 reps - Supine Bridge with Resistance Band  - 1 x daily - 7 x weekly - 2 sets - 10 reps - Single Leg Stance on Foam Pad  - 1 x daily - 7 x weekly - 3 sets - 30sec hold - Side Stepping with Resistance at Thighs  - 1 x daily - 7 x weekly - 2 sets - 10 reps      ASSESSMENT:   CLINICAL  IMPRESSION:  Patient has made excellent progress toward his goals and has been able to continue with childcare duties for his son with SMA and work duties as Cabin crew without reproduction of hip pain. Pt has good hip ROM at this time without pain reproduction. Pt has made good progress over last 2 weeks and is currently ready to complete trial of HEP. We will check in following trial of HEP prior to discharging case. Patient will benefit from continued skilled therapeutic intervention to address the above deficits as needed for improved function and QoL.       OBJECTIVE IMPAIRMENTS: difficulty walking, decreased ROM, decreased strength, hypomobility, impaired flexibility, and pain.    ACTIVITY LIMITATIONS: carrying, lifting, bending, sitting,  squatting, and transfers   PARTICIPATION LIMITATIONS: cleaning, driving, community activity, occupation, and caring for son with special needs   PERSONAL FACTORS: Past/current experiences and 3+ comorbidities: HTN, leukemia in remission and response to cancer treatment, Hx of stroke)  are also affecting patient's functional outcome.    REHAB POTENTIAL: Good   CLINICAL DECISION MAKING: Evolving/moderate complexity   EVALUATION COMPLEXITY: Moderate     GOALS: Goals reviewed with patient? Yes   SHORT TERM GOALS: Target date: 11/03/2022   Pt will be independent with HEP in order to improve strength and decrease back pain to improve pain-free function at home and work. Baseline: 10/12/22: Baseline HEP initiated.   11/29/22: Pt is compliant with HEP.  Goal status: ACHIEVED     LONG TERM GOALS: Target date: 11/26/2022   Pt will increase FOTO to at least 70 to demonstrate significant improvement in function at home and work related to back pain  Baseline: 10/12/22: 59.    11/29/22:  85/70 Goal status: ACHIEVED   2.  Pt will decrease worst hip pain by at least 3 points on the NPRS in order to demonstrate clinically significant reduction in back  pain. Baseline: 10/12/22: 9/10 pain at worst.  11/29/22: no notable pain over last week Goal status: ACHIEVED   3.  Pt will tolerate sitting > 1 hour or more without reproduction of L hip pain as needed for spending leisure time with family and longer drives     Baseline: 10/12/22: Pain and limitation with sitting > 30 min.   11/29/22: Pain with sitting in restaurant chairs - pt has no issue with car seat or seats in his home Goal status: IN PROGRESS   4.  Patient will have no pain with loaded hip hinge mimicking demands of leaning over his son's bed to complete caregiving activities and aid with treatments  Baseline: 10/12/22: significant pain with trunk flexion and leaning over his son's bed to care for him.   11/29/22: Pt is able to perform this today with no pain  Goal status: ACHIEVED   5.  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   11/29/22: Performed today with no significant deviations or asymmetries  Goal status: ACHIEVED      PLAN: PT FREQUENCY: 1-2x/week   PT DURATION: 6 weeks   PLANNED INTERVENTIONS: Therapeutic exercises, Therapeutic activity, Neuromuscular re-education, Balance training, Patient/Family education, Self Care, Joint mobilization, Joint manipulation, Dry Needling, Electrical stimulation, Spinal manipulation, Spinal mobilization, Cryotherapy, Moist heat, Taping, Traction, Ultrasound, Manual therapy, and Re-evaluation.   PLAN FOR NEXT SESSION: Manual therapy with use of Mulligan techniques for hip mobility, STM/DTM for sensitized hip flexor/groin region. Progress hip mobility as tolerated in pain-free ranges and progress with isometrics followed by isotonics as tolerated by pt.  Will  complete trial of independent HEP x 2 weeks following this week with anticipated discharge from PT.   Valentina Gu, PT, DPT #[id number]  Eilleen Kempf, PT 12/05/2022, 11:38 AM

## 2022-12-05 ENCOUNTER — Encounter: Payer: Self-pay | Admitting: Physical Therapy

## 2022-12-14 ENCOUNTER — Encounter: Payer: Self-pay | Admitting: Physical Therapy

## 2022-12-20 ENCOUNTER — Ambulatory Visit: Payer: Medicaid Other | Admitting: Physical Therapy

## 2023-03-09 DIAGNOSIS — S83512A Sprain of anterior cruciate ligament of left knee, initial encounter: Secondary | ICD-10-CM | POA: Insufficient documentation

## 2023-03-13 ENCOUNTER — Ambulatory Visit: Payer: Medicaid Other | Attending: Orthopedic Surgery

## 2023-03-13 ENCOUNTER — Encounter: Payer: Self-pay | Admitting: Physical Therapy

## 2023-03-13 DIAGNOSIS — R262 Difficulty in walking, not elsewhere classified: Secondary | ICD-10-CM | POA: Insufficient documentation

## 2023-03-13 DIAGNOSIS — M6281 Muscle weakness (generalized): Secondary | ICD-10-CM | POA: Diagnosis present

## 2023-03-13 DIAGNOSIS — M25562 Pain in left knee: Secondary | ICD-10-CM
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
## 2023-03-20 ENCOUNTER — Ambulatory Visit: Payer: Medicaid Other | Admitting: Physical Therapy

## 2023-03-20 DIAGNOSIS — M6281 Muscle weakness (generalized): Secondary | ICD-10-CM | POA: Diagnosis not present

## 2023-03-20 DIAGNOSIS — R262 Difficulty in walking, not elsewhere classified: Secondary | ICD-10-CM

## 2023-03-20 DIAGNOSIS — M25562 Pain in left knee: Secondary | ICD-10-CM
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
## 2023-03-23 ENCOUNTER — Ambulatory Visit: Payer: Medicaid Other | Admitting: Physical Therapy

## 2023-03-23 DIAGNOSIS — M6281 Muscle weakness (generalized): Secondary | ICD-10-CM | POA: Diagnosis not present

## 2023-03-23 DIAGNOSIS — R262 Difficulty in walking, not elsewhere classified: Secondary | ICD-10-CM

## 2023-03-23 DIAGNOSIS — M25562 Pain in left knee: Secondary | ICD-10-CM
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
## 2023-04-05 ENCOUNTER — Ambulatory Visit: Payer: Medicaid Other | Attending: Orthopedic Surgery | Admitting: Physical Therapy

## 2023-04-05 DIAGNOSIS — M25562 Pain in left knee: Secondary | ICD-10-CM | POA: Insufficient documentation

## 2023-04-05 DIAGNOSIS — R262 Difficulty in walking, not elsewhere classified: Secondary | ICD-10-CM | POA: Insufficient documentation
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
## 2023-04-11 ENCOUNTER — Ambulatory Visit: Payer: Medicaid Other | Admitting: Physical Therapy

## 2023-04-11 DIAGNOSIS — M25562 Pain in left knee: Secondary | ICD-10-CM

## 2023-04-11 DIAGNOSIS — R262 Difficulty in walking, not elsewhere classified: Secondary | ICD-10-CM
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
## 2023-04-13 ENCOUNTER — Ambulatory Visit: Payer: Medicaid Other | Admitting: Physical Therapy

## 2023-04-18 ENCOUNTER — Encounter: Payer: Medicaid Other | Admitting: Physical Therapy

## 2023-04-25 ENCOUNTER — Encounter: Payer: Medicaid Other | Admitting: Physical Therapy

## 2023-05-01 ENCOUNTER — Ambulatory Visit: Payer: Medicaid Other | Attending: Orthopedic Surgery | Admitting: Physical Therapy

## 2023-05-01 ENCOUNTER — Encounter: Payer: Self-pay | Admitting: Physical Therapy

## 2023-05-01 ENCOUNTER — Encounter: Payer: Medicaid Other | Admitting: Physical Therapy

## 2023-05-01 DIAGNOSIS — R262 Difficulty in walking, not elsewhere classified: Secondary | ICD-10-CM | POA: Diagnosis present

## 2023-05-01 DIAGNOSIS — M6281 Muscle weakness (generalized): Secondary | ICD-10-CM | POA: Diagnosis present

## 2023-05-01 DIAGNOSIS — M25562 Pain in left knee: Secondary | ICD-10-CM | POA: Insufficient documentation
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
## 2023-05-09 ENCOUNTER — Ambulatory Visit: Payer: Medicaid Other | Admitting: Physical Therapy
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
## 2023-05-11 ENCOUNTER — Ambulatory Visit: Payer: Medicaid Other | Admitting: Physical Therapy

## 2023-05-11 ENCOUNTER — Encounter: Payer: Self-pay | Admitting: Physical Therapy

## 2023-05-11 DIAGNOSIS — R262 Difficulty in walking, not elsewhere classified: Secondary | ICD-10-CM

## 2023-05-11 DIAGNOSIS — M25562 Pain in left knee: Secondary | ICD-10-CM | POA: Diagnosis not present

## 2023-05-11 DIAGNOSIS — M6281 Muscle weakness (generalized): Secondary | ICD-10-CM
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
## 2023-05-16 ENCOUNTER — Ambulatory Visit: Payer: Medicaid Other | Admitting: Physical Therapy

## 2023-05-16 DIAGNOSIS — R262 Difficulty in walking, not elsewhere classified: Secondary | ICD-10-CM

## 2023-05-16 DIAGNOSIS — M25562 Pain in left knee: Secondary | ICD-10-CM

## 2023-05-16 DIAGNOSIS — M6281 Muscle weakness (generalized): Secondary | ICD-10-CM
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
## 2023-05-18 ENCOUNTER — Ambulatory Visit: Payer: Medicaid Other | Admitting: Physical Therapy

## 2023-05-19 ENCOUNTER — Encounter: Payer: Self-pay | Admitting: Physical Therapy

## 2023-05-23 ENCOUNTER — Ambulatory Visit: Payer: Medicaid Other | Admitting: Physical Therapy

## 2023-05-23 DIAGNOSIS — M6281 Muscle weakness (generalized): Secondary | ICD-10-CM

## 2023-05-23 DIAGNOSIS — R262 Difficulty in walking, not elsewhere classified: Secondary | ICD-10-CM

## 2023-05-23 DIAGNOSIS — M25562 Pain in left knee: Secondary | ICD-10-CM
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
## 2023-05-25 ENCOUNTER — Ambulatory Visit: Payer: Medicaid Other | Admitting: Physical Therapy

## 2023-05-25 ENCOUNTER — Encounter: Payer: Self-pay | Admitting: Physical Therapy

## 2023-05-30 ENCOUNTER — Ambulatory Visit: Payer: Medicaid Other | Admitting: Physical Therapy

## 2023-05-30 ENCOUNTER — Encounter: Payer: Self-pay | Admitting: Physical Therapy

## 2023-05-30 DIAGNOSIS — M25562 Pain in left knee: Secondary | ICD-10-CM | POA: Diagnosis not present

## 2023-05-30 DIAGNOSIS — M6281 Muscle weakness (generalized): Secondary | ICD-10-CM

## 2023-05-30 DIAGNOSIS — R262 Difficulty in walking, not elsewhere classified: Secondary | ICD-10-CM
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
## 2023-06-01 ENCOUNTER — Ambulatory Visit: Payer: Medicaid Other | Admitting: Physical Therapy

## 2023-06-06 ENCOUNTER — Encounter: Payer: Medicaid Other | Admitting: Physical Therapy

## 2023-06-08 ENCOUNTER — Encounter: Payer: Medicaid Other | Admitting: Physical Therapy

## 2023-06-09 ENCOUNTER — Ambulatory Visit: Payer: Medicaid Other

## 2023-06-09 DIAGNOSIS — Z83719 Family history of colon polyps, unspecified: Secondary | ICD-10-CM

## 2023-06-09 DIAGNOSIS — Z1211 Encounter for screening for malignant neoplasm of colon: Secondary | ICD-10-CM

## 2023-06-13 ENCOUNTER — Ambulatory Visit: Payer: Medicaid Other | Attending: Orthopedic Surgery | Admitting: Physical Therapy

## 2023-06-15 ENCOUNTER — Ambulatory Visit: Payer: Medicaid Other | Admitting: Physical Therapy

## 2023-06-20 ENCOUNTER — Encounter: Payer: Medicaid Other | Admitting: Physical Therapy

## 2023-06-22 ENCOUNTER — Encounter: Payer: Medicaid Other | Admitting: Physical Therapy

## 2023-07-02 DIAGNOSIS — S62339A Displaced fracture of neck of unspecified metacarpal bone, initial encounter for closed fracture: Secondary | ICD-10-CM | POA: Insufficient documentation

## 2023-07-28 DIAGNOSIS — S62306A Unspecified fracture of fifth metacarpal bone, right hand, initial encounter for closed fracture: Secondary | ICD-10-CM | POA: Insufficient documentation

## 2024-03-21 ENCOUNTER — Encounter: Payer: Self-pay | Admitting: Physician Assistant

## 2024-03-21 ENCOUNTER — Ambulatory Visit (INDEPENDENT_AMBULATORY_CARE_PROVIDER_SITE_OTHER): Admitting: Physician Assistant

## 2024-03-21 VITALS — BP 110/86 | HR 82 | Temp 98.6°F | Ht 70.0 in | Wt 239.0 lb

## 2024-03-21 DIAGNOSIS — R7303 Prediabetes: Secondary | ICD-10-CM

## 2024-03-21 DIAGNOSIS — M25469 Effusion, unspecified knee: Secondary | ICD-10-CM | POA: Insufficient documentation

## 2024-03-21 DIAGNOSIS — C9241 Acute promyelocytic leukemia, in remission: Secondary | ICD-10-CM

## 2024-03-21 DIAGNOSIS — Z0000 Encounter for general adult medical examination without abnormal findings: Secondary | ICD-10-CM | POA: Diagnosis not present

## 2024-03-21 DIAGNOSIS — Z2821 Immunization not carried out because of patient refusal: Secondary | ICD-10-CM | POA: Diagnosis not present

## 2024-03-21 DIAGNOSIS — L409 Psoriasis, unspecified: Secondary | ICD-10-CM | POA: Diagnosis not present

## 2024-03-21 DIAGNOSIS — Z8673 Personal history of transient ischemic attack (TIA), and cerebral infarction without residual deficits: Secondary | ICD-10-CM

## 2024-03-21 DIAGNOSIS — C9590 Leukemia, unspecified not having achieved remission: Secondary | ICD-10-CM | POA: Insufficient documentation

## 2024-03-21 DIAGNOSIS — G43909 Migraine, unspecified, not intractable, without status migrainosus: Secondary | ICD-10-CM | POA: Insufficient documentation

## 2024-03-21 NOTE — Progress Notes (Signed)
 Date:  03/21/2024   Name:  Gabriel English   DOB:  [birth year]   MRN:  [id number]   Chief Complaint: Establish Care  HPI [name] is a pleasant 43 year old male with a history of leukemia, SVT, severe OSA, psoriasis, pre-DM, HTN, prior ischemic CVA, and migraines who presents to the clinic today to establish care.  Has some irritability issues per wife April who joins him today, though this is getting better slowly.  Last Physical: 1y ago Last Dental Exam: 2y ago  Last Eye Exam: Aug 2024 [facility] Last CRC screen: Aug 2024 normal. Started screening early due to fam hx polyps. Repeat 5y.  Immunizations Due: Prevnar 20    Medication list has been reviewed and updated.  Current Meds  Medication Sig   ascorbic Acid (VITAMIN C) 500 MG CPCR Take 500 mg by mouth daily.   aspirin 81 MG chewable tablet Chew 81 mg by mouth daily.   diltiazem (CARDIZEM) 30 MG tablet Take 30 mg by mouth as needed.   Galcanezumab-gnlm 120 MG/ML SOAJ Inject 120 mg/mL into the skin every 28 (twenty-eight) days.   ketorolac  (TORADOL ) 10 MG tablet Take 10 mg by mouth as needed for severe pain (onset of migraine).   losartan (COZAAR) 25 MG tablet Take 25 mg by mouth 2 (two) times daily.   nortriptyline (PAMELOR) 10 MG capsule Take 10 mg by mouth at bedtime. 4 tablets at night   rosuvastatin (CRESTOR) 40 MG tablet Take 40 mg by mouth at bedtime.   Ubrogepant 100 MG TABS Take 100 mg by mouth as needed (onset of migraine).     Review of Systems  Patient Active Problem List   Diagnosis Date Noted   Knee joint effusion 03/21/2024   Leukemia (HCC) 03/21/2024   Migraine 03/21/2024   Closed fracture of fifth metacarpal bone of right hand 07/28/2023   Fracture of neck of metacarpal bone 07/02/2023   Left anterior cruciate ligament tear 03/09/2023   Enthesopathy of left hip region 10/06/2022   Severe obstructive sleep apnea 08/27/2022   Migraine without aura and without status migrainosus, not intractable  03/07/2022   Prediabetes 03/07/2022   Psoriasis 03/07/2022   Essential hypertension 10/07/2021   Blurry vision 09/29/2021   Left-sided weakness 09/29/2021   Headache disorder 11/10/2020   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) 09/08/2020   SVT (supraventricular tachycardia) (HCC) 05/20/2014   Tachycardia 05/13/2014    No Known Allergies  Immunization History  Administered Date(s) Administered   Influenza Inj Mdck Quad Pf 10/07/2021   PFIZER(Purple Top)SARS-COV-2 Vaccination 01/19/2020, 02/09/2020   Tdap 10/28/2018    Past Surgical History:  Procedure Laterality Date   COLONOSCOPY     FOOT SURGERY Left    childhood   KIDNEY STONE SURGERY     SHOULDER SURGERY Bilateral    7 on left and 2 on right   WRIST SURGERY Right     Social History   Tobacco Use   Smoking status: Never   Smokeless tobacco: Never  Vaping Use   Vaping status: Never Used  Substance Use Topics   Alcohol use: Yes    Comment: rarely   Drug use: No    Family History  Problem Relation Age of Onset   Heart disease Mother    Hypertension Mother    Heart disease Father    Hypertension Father    Cancer Father    Diabetes Maternal Grandmother         03/21/2024    2:23 PM  GAD 7 : Generalized Anxiety Score  Nervous, Anxious, on Edge 0  Control/stop worrying 0  Worry too much - different things 0  Trouble relaxing 0  Restless 0  Easily annoyed or irritable 1  Afraid - awful might happen 0  Total GAD 7 Score 1  Anxiety Difficulty Not difficult at all       03/21/2024    2:23 PM  Depression screen PHQ 2/9  Decreased Interest 0  Down, Depressed, Hopeless 0  PHQ - 2 Score 0    BP Readings from Last 3 Encounters:  03/21/24 110/86  07/27/12 123/81    Wt Readings from Last 3 Encounters:  03/21/24 239 lb (108.4 kg)    BP 110/86   Pulse 82   Temp 98.6 F (37 C)   Ht 5\' 10"  (1.778 m)   Wt 239 lb (108.4 kg)   SpO2 95%   BMI 34.29 kg/m   Physical Exam Vitals and  nursing note reviewed.  Constitutional:      Appearance: Normal appearance.  HENT:     Ears:     Comments: EAC clear bilaterally with good view of TM which is without effusion or erythema.     Nose: Nose normal.     Mouth/Throat:     Mouth: Mucous membranes are moist. No oral lesions.     Dentition: Normal dentition.     Pharynx: No posterior oropharyngeal erythema.  Eyes:     Extraocular Movements: Extraocular movements intact.     Conjunctiva/sclera: Conjunctivae normal.     Pupils: Pupils are equal, round, and reactive to light.  Neck:     Thyroid: No thyromegaly.  Cardiovascular:     Rate and Rhythm: Normal rate and regular rhythm.     Heart sounds: No murmur heard.    No friction rub. No gallop.     Comments: Pulses 2+ at radial, PT, DP bilaterally. No carotid bruit. No peripheral edema Pulmonary:     Effort: Pulmonary effort is normal.     Breath sounds: Normal breath sounds.  Abdominal:     General: Bowel sounds are normal.     Palpations: Abdomen is soft. There is no mass.     Tenderness: There is no abdominal tenderness.  Genitourinary:    Comments: Genital/rectal exam deferred. Reviewed technique for testicular self-exam. Musculoskeletal:     Comments: Full ROM with strength 5/5 bilateral upper and lower extremities  Lymphadenopathy:     Cervical: No cervical adenopathy.  Skin:    General: Skin is warm.     Capillary Refill: Capillary refill takes less than 2 seconds.     Findings: Rash present. No lesion.     Comments: Psoriatic plaques of bilateral knees and elbows  Neurological:     Mental Status: He is alert and oriented to person, place, and time.     Gait: Gait is intact.  Psychiatric:        Mood and Affect: Mood normal.        Behavior: Behavior normal.     Recent Labs     Component Value Date/Time   NA 142 05/08/2014 1732   K 3.8 05/08/2014 1732   CL 106 05/08/2014 1732   CO2 26 05/08/2014 1732   GLUCOSE 103 (H) 05/08/2014 1732   BUN 10  05/08/2014 1732   CREATININE 1.10 05/08/2014 1732   CALCIUM 9.2 05/08/2014 1732   PROT 7.4 05/08/2014 1732   ALBUMIN 4.1 05/08/2014 1732   AST 20 05/08/2014 1732  ALT 34 05/08/2014 1732   ALKPHOS 73 05/08/2014 1732   BILITOT 0.6 05/08/2014 1732   GFRNONAA >60 05/08/2014 1732   GFRAA >60 05/08/2014 1732    Lab Results  Component Value Date   WBC 8.2 05/08/2014   HGB 15.0 05/08/2014   HCT 43.8 05/08/2014   MCV 90 05/08/2014   PLT 216 05/08/2014   No results found for: "HGBA1C" No results found for: "CHOL", "HDL", "LDLCALC", "LDLDIRECT", "TRIG", "CHOLHDL" No results found for: "TSH"   Assessment and Plan:  1. Annual physical exam (Primary) Minor abnormalities on exam. Encouraged healthy lifestyle including regular physical activity and consumption of whole fruits and vegetables. Encouraged routine dental and eye exams.  Ordering lipids and A1c today, but instructed that I would like for him to present for fasting labs at a later date  - Lipid panel - Hemoglobin A1c  2. Immunization declined Recommended Prevnar 20 due to increased risk with leukemia among other chronic health conditions.  He declines today, but will consider.  3. 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 Psoriasis Offered dermatology referral, but patient has a particular provider in mind - just has to figure out where she is practicing and he will let me know where to send the referral.  5. Prediabetes - Hemoglobin A1c  6. History of ischemic stroke Continue rosuvastatin as prescribed.  Check fasting lipids when able - Lipid panel   No follow-ups on file.    Cody Das, PA-C, DMSc, Nutritionist [facility] [phone]

## 2024-03-21 NOTE — Patient Instructions (Addendum)
-  It was a pleasure to see you today! Please review your visit summary for helpful information -Lab results are usually available within 1-2 days and we will call once reviewed -I would encourage you to follow your care via MyChart where you can access lab results, notes, messages, and more -If you feel that we did a nice job today, please complete your after-visit survey and leave us  a Google review! Your CMA today was Kieandra and your provider was Cody Das, PA-C, DMSc -Please return for follow-up in about 6 months  Consider getting your Prevnar 20 vaccine to protect against pneumonia

## 2024-09-11 ENCOUNTER — Ambulatory Visit: Admitting: Physician Assistant

## 2024-09-24 NOTE — Progress Notes (Signed)
 Telemedicine video encounter documentation  This video encounter was conducted with the patient's (or proxy's) verbal consent via secure, interactive audio and video telecommunications while in clinic/office/hospital.  The patient (or proxy) was instructed to have this encounter in a suitably private space and to only have persons present to whom they give permission to participate. In addition, patient identity was confirmed by use of name plus two identifiers.   Chief Complaint:   Chief Complaint  Patient presents with  . Follow-up    Patient confirms he is in the state of [place] during this telehealth visit.     Subjective   Gabriel English is a 43 y.o. male who presents for Follow-up (Patient confirms he is in the state of [place] during this telehealth visit. ) HPI  History of Present Illness Gabriel English is a 43 year old male with severe sleep apnea who presents for a yearly CPAP compliance visit.  He has a previously noted apnea-hypopnea index (AHI) of 37 per hour. He is currently using a CPAP machine and reports good compliance with its use. He goes to bed between 10 and 11 PM, wakes up at 3 AM to attend to his son's feeding tube, and then returns to sleep until 6 or 6:30 AM. He generally feels refreshed upon waking.  No issues with the CPAP mask, pressure, dryness, or congestion. He sometimes needs to reset the machine when it ramps up to 60, preferring it to start at 40 before ramping up again once he falls asleep. No issues obtaining supplies, though he notes the need to remember to call in for them.  Prior Studies and Other Data: A HST was completed on 07/29/2022, results as below: INTERPRETATION: The study was technically adequate for interpretation. Total monitoring time (TMT) was 420 minutes. The overall REI(AHI) was 37/hour with an oxygen saturation nadir of 88%. Supine sleep time was 43 % of TRT. Supine AHI was 36/hr. Mean heart rate while asleep was 75 bpm. This overnight  polysomnogram demonstrates that the patient has:  1) Severe obstructive sleep apnea with overall REI/AHI of 37/hr.   CLINICAL CORRELATION: This degree of sleep apnea may contribute to daytime sleepiness and long-term cardiovascular morbidity. 1) Positive airway pressure therapy is indicated.  Advise to proceed with a(n) CPAP titration study to optimize therapy.  Patient Active Problem List  Diagnosis  . Inappropriate sinus tachycardia (HHS-HCC)  . Essential hypertension  . History of arterial ischemic stroke  . Prediabetes  . Psoriasis  . 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 Migraine without aura and without status migrainosus, not intractable  . Severe obstructive sleep apnea  . SVT (supraventricular tachycardia) (HHS-HCC)  . Renal stone    Outpatient Medications Prior to Visit  Medication Sig Dispense Refill  . ascorbic acid, vitamin C, 500 mg Chew     . aspirin 81 MG chewable tablet     . busPIRone (BUSPAR) 5 MG tablet Days 1-3: 5 mg (1 tablet) in AM. Days 4-6: 5 mg (1 tablet) twice daily in AM and PM. Days 7-9: 10 mg (2 tablets) in AM and 5 mg (1 tablet) in PM. Day 10: 10 mg (2 tablets) twice daily in AM and PM. Continue if tolerated. Return to previous dose if increase is not tolerate. 60 tablet 1  . dilTIAZem (CARDIZEM) 30 MG tablet Take 1 tablet (30 mg total) by mouth 3 (three) times daily as needed 60 tablet 1  . galcanezumab-gnlm 120 mg/mL PnIj Inject 120 mg subcutaneously every 28 (  twenty-eight) days 1 mL 11  . hydrOXYzine HCL (ATARAX) 10 MG tablet TAKE 1 TO 2 TABLETS BY MOUTH UP TO THREE TIMES DAILY AS NEEDED FOR  ANXIETY 30 tablet 0  . ketorolac  (TORADOL ) 10 mg tablet Take 1 tablet (10 mg total) by mouth once daily as needed (headache, limit use to up to twice per week) 20 tablet 0  . losartan (COZAAR) 25 MG tablet Take 1 tablet (25 mg total) by mouth 2 (two) times daily 180 tablet 3  . nortriptyline (PAMELOR) 10 MG capsule Take 4 capsules (40 mg  total) by mouth at bedtime 360 capsule 2  . rosuvastatin (CRESTOR) 40 MG tablet Take 1 tablet (40 mg total) by mouth at bedtime 90 tablet 3  . ubrogepant 100 mg Tab Take 100 mg by mouth as directed (take at migraine onst, ok to repeat after 2 hours. Max 200mg  in 24 hrs.) 10 tablet 11  . Pharmacy Test Claim TEST CLAIM ONLY 1 mL 0   No facility-administered medications prior to visit.     Review of Systems    Objective   Vitals as reported by patient: Vitals:   09/24/24 1047  Weight: (!) 108.9 kg (240 lb)  Height: 175.3 cm (5' 9)  PainSc:   7  PainLoc: Generalized   Body mass index is 35.44 kg/m. Physical Exam   Constitutional: alert, interactive with provider, cooperative, in no distress Mental status: oriented x 3, good historian, appropriate mood and behavior, thought content appears normal Respiratory: no respiratory distress, no audible wheezing  Results      Assessment/Plan:   Assessment & Plan Obstructive sleep apnea Severe obstructive sleep apnea with an AHI of 37 per hour. CPAP therapy is well-tolerated with excellent compliance. No issues with mask fit, pressure, dryness, or congestion. Occasionally needs to adjust CPAP pressure manually when it ramps up to 60. Feels refreshed most of the time upon waking. Compliance and efficacy data is excellent. Patient reports improvement in sleep quality and daytime functioning with use of CPAP device. Patient will benefit from continued PAP therapy.  - Continue current CPAP therapy. - Will schedule follow-up in one year.   Diagnoses and all orders for this visit:  Severe obstructive sleep apnea    This visit was coded based on medical decision making (MDM).           Future Appointments     Date/Time Provider Department Center Visit Type   11/11/2024 3:20 PM Defoor, Elsie Conch, PA [facility] C NEW PATIENT   12/24/2024 3:00 PM (Arrive by 2:30 PM) Larraine Rosaline Degree, MD [facility] Cardiology  [facility] [place] CARD RETURN   01/15/2025 2:00 PM (Arrive by 1:45 PM) STUD[facility]   01/15/2025 3:00 PM (Arrive by 2:45 PM) Claudene Suzen Hurst, NP [facility] RETURN VISIT   01/23/2025 4:00 PM (Arrive by 3:30 PM) Darol Bayard, MD [facility] [address] [facility] [place] RETURN VISIT   03/26/2025 3:30 PM Landy Margaretann Kay, NP [facility] [place] [facility] PHYSICAL       There are no Patient Instructions on file for this visit.  An after visit summary was provided for the patient either in written format (printed) or through [facility].  This note has been created using automated tools and reviewed for accuracy by [facility].

## 2024-10-03 ENCOUNTER — Ambulatory Visit (INDEPENDENT_AMBULATORY_CARE_PROVIDER_SITE_OTHER): Admitting: Urology

## 2024-10-03 VITALS — BP 138/102 | HR 98 | Ht 69.0 in | Wt 238.0 lb

## 2024-10-03 DIAGNOSIS — Z125 Encounter for screening for malignant neoplasm of prostate: Secondary | ICD-10-CM

## 2024-10-03 DIAGNOSIS — E291 Testicular hypofunction: Secondary | ICD-10-CM | POA: Diagnosis not present

## 2024-10-03 MED ORDER — CLOMIPHENE CITRATE 50 MG PO TABS: 25.0000 mg | ORAL_TABLET | Freq: Every day | ORAL | 5 refills | Status: DC

## 2024-10-03 NOTE — Progress Notes (Signed)
   10/03/24 3:14 PM   Gabriel English [birth year] [id number]  CC: Hypogonadism, history of kidney stones, PSA screening  HPI: 43 year old male here with his wife today for hypogonadism.  Symptoms are fatigue, tiredness, apathy.  He denies any ED.  2 testosterone levels were less than 300.  He has a history of stones.  He also has a history of leukemia/AML that was treated at [facility], feels like his symptoms started at that time.  Has sleep apnea and compliant with CPAP.  He and his wife do not think they desire any further biologic pregnancies.   PMH: Past Medical History:  Diagnosis Date   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)    Essential hypertension    History of arterial ischemic stroke    Left-sided weakness    Migraine without aura and without status migrainosus, not intractable    Prediabetes    Psoriasis    Severe obstructive sleep apnea    Tachycardia     Surgical History: Past Surgical History:  Procedure Laterality Date   COLONOSCOPY     FOOT SURGERY Left    childhood   KIDNEY STONE SURGERY     SHOULDER SURGERY Bilateral    7 on left and 2 on right   WRIST SURGERY Right      Family History: Family History  Problem Relation Age of Onset   Heart disease Mother    Hypertension Mother    Colon polyps Mother    Heart disease Father    Hypertension Father    Lung cancer Father        metastatic   Diabetes Maternal Grandmother     Social History:  reports that he has never smoked. He has never used smokeless tobacco. He reports current alcohol use. He reports that he does not use drugs.  Physical Exam: BP (!) 138/102 (BP Location: Left Arm, Patient Position: Sitting, Cuff Size: Large)   Pulse 98   Ht 5' 9 (1.753 m)   Wt 238 lb (108 kg)   SpO2 97%   BMI 35.15 kg/m    Constitutional:  Alert and oriented, No acute distress. Cardiovascular: No clubbing, cyanosis, or edema. Respiratory: Normal respiratory effort, no increased work of  breathing. GI: Abdomen is soft, nontender, nondistended, no abdominal masses   Assessment & Plan:   42 year old male with hypogonadism, 2 testosterone levels below 300, and symptoms of fatigue, tiredness, apathy.  We discussed options including lifestyle changes with weight loss, healthy eating, exercise, trial of Clomid, or TRT.  Risks and benefits were reviewed extensively, they are primarily interested in a trial of Clomid.  PSA normal at 1.1, reassurance provided.  Trial of Clomid 25 mg daily, RTC 6 to 8 weeks morning testosterone prior   Redell Burnet, MD 10/03/2024  [facility]rology [address] [place], [place] [address] [phone]

## 2024-10-03 NOTE — Patient Instructions (Signed)
 Your medication has been sent to [facility]. This is an Therapist, occupational that offers medication at a significantly discounted price. You will need to go to the website ([email or web address]) to sign up for an account, give your address and enter your payment method.

## 2024-10-07 DIAGNOSIS — E291 Testicular hypofunction: Secondary | ICD-10-CM

## 2024-10-07 MED ORDER — CLOMIPHENE CITRATE 50 MG PO TABS: 25.0000 mg | ORAL_TABLET | Freq: Every day | ORAL | 5 refills | Status: AC

## 2024-11-14 ENCOUNTER — Other Ambulatory Visit: Payer: Self-pay | Admitting: Rheumatology

## 2024-11-14 DIAGNOSIS — L4050 Arthropathic psoriasis, unspecified: Secondary | ICD-10-CM

## 2024-11-16 ENCOUNTER — Ambulatory Visit
Admission: RE | Admit: 2024-11-16 | Discharge: 2024-11-16 | Disposition: A | Source: Ambulatory Visit | Attending: Rheumatology | Admitting: Rheumatology

## 2024-11-16 DIAGNOSIS — L4050 Arthropathic psoriasis, unspecified: Secondary | ICD-10-CM

## 2024-11-19 ENCOUNTER — Other Ambulatory Visit

## 2024-11-19 DIAGNOSIS — E291 Testicular hypofunction: Secondary | ICD-10-CM

## 2024-11-20 LAB — TESTOSTERONE: Testosterone: 277 ng/dL (ref 264–916)

## 2024-11-28 ENCOUNTER — Ambulatory Visit (INDEPENDENT_AMBULATORY_CARE_PROVIDER_SITE_OTHER): Admitting: Urology

## 2024-11-28 VITALS — BP 134/90 | HR 103 | Ht 69.0 in | Wt 248.0 lb

## 2024-11-28 DIAGNOSIS — N529 Male erectile dysfunction, unspecified: Secondary | ICD-10-CM

## 2024-11-28 DIAGNOSIS — E291 Testicular hypofunction: Secondary | ICD-10-CM

## 2024-11-28 MED ORDER — TADALAFIL 5 MG PO TABS: 5.0000 mg | ORAL_TABLET | Freq: Every day | ORAL | 11 refills | Status: AC | PRN

## 2024-11-28 NOTE — Patient Instructions (Signed)
 Understanding Erectile Dysfunction (ED)  What is ED? Erectile Dysfunction, or ED, is when a man has trouble getting or keeping an erection enough for sex. It is common and can happen at any age but is more common as men get older.  What Causes ED? ED can happen for many reasons, including:    Stress or feeling anxious Health problems like diabetes, heart disease, or high blood pressure Lifestyle habits like smoking, drinking too much alcohol, drug use, or not enough exercise Certain medicines(some blood pressure medications, antidepressants, sedatives)  Symptoms of ED:   Difficulty getting an erection Trouble keeping an erection during sex Reduced interest in sex  Treatment Options There are different ways to treat ED. Talk to your doctor to find what's best for you.  Lifestyle Changes   Exercise regularly Eat healthy foods Quit smoking and limit alcohol Weight loss Reduce stress  Medications   Oral medicines like Viagra(sildenafil) or Cialis (tadalafil ).  These are generic and affordable, the lowest price is at [email or web address].  These must be taken about an hour before sexual activity, and still require sexual stimulation to get an erection Side effects can include stuffy nose, headache, muscle pain, or changes in vision There is no risk of heart attack or stroke when medications are taken as directed These medications cannot be taken with nitrates for chest pain  Other Treatments    Penile injections-a medicine is injected directly into the penis to give you an immediate erection Devices like pump vacuum systems that help create an erection Surgery: Penile prosthesis for patients with no improvement with medicines or injections who are still interested in sexual activity.  Visit [email or web address] for more details.  Dr. Lovie is a urologist in [facility] with [facility] who performs penile prosthesis surgeries Counseling or Therapy    If ED is caused by  stress, anxiety, or relationship issues, talking to a counselor or therapist can help.

## 2024-11-28 NOTE — Progress Notes (Signed)
" ° °  11/28/2024 3:03 PM   Aryeh Butterfield [birth year] [id number]  Reason for visit: Follow up hypogonadism, history of kidney stones, PSA screening, ED  History: Initial visit December 2025 for hypogonadism with low testosterone  levels of 211 and 263.  Primary symptom is fatigue, tiredness, apathy Also has a history of leukemia/AML treated at [facility] and feels like his symptoms started at that time Sleep apnea compliant with CPAP He and his wife do not think they desire further biologic pregnancies  Physical Exam: BP (!) 134/90 (BP Location: Left Arm, Patient Position: Sitting, Cuff Size: Large)   Pulse (!) 103   Ht 5' 9 (1.753 m)   Wt 248 lb (112.5 kg)   SpO2 97%   BMI 36.62 kg/m   Imaging/labs: Testosterone  11/19/2024 on Clomid  increased to 277 from 211 prior  Today: No significant change in energy level since starting Clomid , may be very subtle improvement Worsening ED and interested in medications at this point  Plan:   Hypogonadism: Will check LH, prolactin, estradiol, continue Clomid  and if other labs normal recheck testosterone  in 4 to 6 weeks.  If testosterone  remains low and symptomatic would then consider testosterone  cypionate injections ED: Trial of Cialis  5 to 20 mg on demand Call with lab results   Redell JAYSON Burnet, MD  [facility] Urology [address] [place], [place] [address] [phone]  "

## 2024-12-11 ENCOUNTER — Other Ambulatory Visit
